# Patient Record
Sex: Male | Born: 1970 | Race: Black or African American | Hispanic: No | Marital: Married | Smoking: Never smoker
Health system: Southern US, Community
[De-identification: ages and names within clinical notes are randomized; demographics above are authoritative.]

## PROBLEM LIST (undated history)

## (undated) DIAGNOSIS — E119 Type 2 diabetes mellitus without complications: Secondary | ICD-10-CM

## (undated) DIAGNOSIS — H35039 Hypertensive retinopathy, unspecified eye: Secondary | ICD-10-CM

## (undated) DIAGNOSIS — E11319 Type 2 diabetes mellitus with unspecified diabetic retinopathy without macular edema: Secondary | ICD-10-CM

## (undated) DIAGNOSIS — I10 Essential (primary) hypertension: Secondary | ICD-10-CM

## (undated) HISTORY — PX: NO PAST SURGERIES: SHX2092

## (undated) HISTORY — DX: Type 2 diabetes mellitus with unspecified diabetic retinopathy without macular edema: E11.319

## (undated) HISTORY — DX: Hypertensive retinopathy, unspecified eye: H35.039

---

## 2016-06-26 ENCOUNTER — Encounter (INDEPENDENT_AMBULATORY_CARE_PROVIDER_SITE_OTHER): Payer: Managed Care, Other (non HMO) | Admitting: Ophthalmology

## 2016-06-26 ENCOUNTER — Encounter (HOSPITAL_COMMUNITY): Payer: Self-pay | Admitting: Emergency Medicine

## 2016-06-26 ENCOUNTER — Observation Stay (HOSPITAL_COMMUNITY)
Admission: EM | Admit: 2016-06-26 | Discharge: 2016-06-28 | Disposition: A | Discharge status: Home / Self Care | Payer: Managed Care, Other (non HMO) | Attending: Internal Medicine | Admitting: Internal Medicine

## 2016-06-26 ENCOUNTER — Observation Stay (HOSPITAL_COMMUNITY): Payer: Managed Care, Other (non HMO)

## 2016-06-26 ENCOUNTER — Emergency Department (HOSPITAL_COMMUNITY): Payer: Managed Care, Other (non HMO)

## 2016-06-26 DIAGNOSIS — Z7982 Long term (current) use of aspirin: Secondary | ICD-10-CM | POA: Diagnosis not present

## 2016-06-26 DIAGNOSIS — I10 Essential (primary) hypertension: Secondary | ICD-10-CM | POA: Diagnosis not present

## 2016-06-26 DIAGNOSIS — E785 Hyperlipidemia, unspecified: Secondary | ICD-10-CM | POA: Insufficient documentation

## 2016-06-26 DIAGNOSIS — H34231 Retinal artery branch occlusion, right eye: Secondary | ICD-10-CM

## 2016-06-26 DIAGNOSIS — E10311 Type 1 diabetes mellitus with unspecified diabetic retinopathy with macular edema: Secondary | ICD-10-CM

## 2016-06-26 DIAGNOSIS — E118 Type 2 diabetes mellitus with unspecified complications: Secondary | ICD-10-CM

## 2016-06-26 DIAGNOSIS — Q211 Atrial septal defect: Secondary | ICD-10-CM

## 2016-06-26 DIAGNOSIS — Z794 Long term (current) use of insulin: Secondary | ICD-10-CM | POA: Diagnosis not present

## 2016-06-26 DIAGNOSIS — E1139 Type 2 diabetes mellitus with other diabetic ophthalmic complication: Secondary | ICD-10-CM | POA: Diagnosis not present

## 2016-06-26 DIAGNOSIS — G453 Amaurosis fugax: Secondary | ICD-10-CM | POA: Diagnosis present

## 2016-06-26 DIAGNOSIS — Z8673 Personal history of transient ischemic attack (TIA), and cerebral infarction without residual deficits: Secondary | ICD-10-CM | POA: Insufficient documentation

## 2016-06-26 DIAGNOSIS — I639 Cerebral infarction, unspecified: Secondary | ICD-10-CM

## 2016-06-26 DIAGNOSIS — H539 Unspecified visual disturbance: Secondary | ICD-10-CM

## 2016-06-26 DIAGNOSIS — H43813 Vitreous degeneration, bilateral: Secondary | ICD-10-CM | POA: Diagnosis not present

## 2016-06-26 DIAGNOSIS — H5461 Unqualified visual loss, right eye, normal vision left eye: Secondary | ICD-10-CM | POA: Insufficient documentation

## 2016-06-26 DIAGNOSIS — H35033 Hypertensive retinopathy, bilateral: Secondary | ICD-10-CM | POA: Diagnosis not present

## 2016-06-26 DIAGNOSIS — R2689 Other abnormalities of gait and mobility: Secondary | ICD-10-CM

## 2016-06-26 DIAGNOSIS — Q2112 Patent foramen ovale: Secondary | ICD-10-CM

## 2016-06-26 DIAGNOSIS — H34211 Partial retinal artery occlusion, right eye: Secondary | ICD-10-CM | POA: Diagnosis not present

## 2016-06-26 DIAGNOSIS — E103513 Type 1 diabetes mellitus with proliferative diabetic retinopathy with macular edema, bilateral: Secondary | ICD-10-CM | POA: Diagnosis not present

## 2016-06-26 DIAGNOSIS — E119 Type 2 diabetes mellitus without complications: Secondary | ICD-10-CM

## 2016-06-26 HISTORY — DX: Essential (primary) hypertension: I10

## 2016-06-26 HISTORY — DX: Type 2 diabetes mellitus without complications: E11.9

## 2016-06-26 LAB — COMPREHENSIVE METABOLIC PANEL
ALT: 16 U/L — AB (ref 17–63)
ANION GAP: 10 (ref 5–15)
AST: 32 U/L (ref 15–41)
Albumin: 4.7 g/dL (ref 3.5–5.0)
Alkaline Phosphatase: 59 U/L (ref 38–126)
BUN: 10 mg/dL (ref 6–20)
CHLORIDE: 101 mmol/L (ref 101–111)
CO2: 27 mmol/L (ref 22–32)
CREATININE: 0.91 mg/dL (ref 0.61–1.24)
Calcium: 10.1 mg/dL (ref 8.9–10.3)
Glucose, Bld: 108 mg/dL — ABNORMAL HIGH (ref 65–99)
Potassium: 4.5 mmol/L (ref 3.5–5.1)
SODIUM: 138 mmol/L (ref 135–145)
Total Bilirubin: 1.2 mg/dL (ref 0.3–1.2)
Total Protein: 7.1 g/dL (ref 6.5–8.1)

## 2016-06-26 LAB — DIFFERENTIAL
BASOS PCT: 1 %
Basophils Absolute: 0 10*3/uL (ref 0.0–0.1)
EOS ABS: 0.4 10*3/uL (ref 0.0–0.7)
EOS PCT: 7 %
Lymphocytes Relative: 45 %
Lymphs Abs: 2.6 10*3/uL (ref 0.7–4.0)
MONO ABS: 0.3 10*3/uL (ref 0.1–1.0)
MONOS PCT: 6 %
NEUTROS ABS: 2.4 10*3/uL (ref 1.7–7.7)
Neutrophils Relative %: 41 %

## 2016-06-26 LAB — CBC
HEMATOCRIT: 46.6 % (ref 39.0–52.0)
Hemoglobin: 15.2 g/dL (ref 13.0–17.0)
MCH: 30.2 pg (ref 26.0–34.0)
MCHC: 32.6 g/dL (ref 30.0–36.0)
MCV: 92.6 fL (ref 78.0–100.0)
PLATELETS: 184 10*3/uL (ref 150–400)
RBC: 5.03 MIL/uL (ref 4.22–5.81)
RDW: 12.3 % (ref 11.5–15.5)
WBC: 5.7 10*3/uL (ref 4.0–10.5)

## 2016-06-26 LAB — I-STAT CHEM 8, ED
BUN: 14 mg/dL (ref 6–20)
CALCIUM ION: 1.15 mmol/L (ref 1.15–1.40)
CHLORIDE: 100 mmol/L — AB (ref 101–111)
Creatinine, Ser: 0.9 mg/dL (ref 0.61–1.24)
GLUCOSE: 105 mg/dL — AB (ref 65–99)
HCT: 48 % (ref 39.0–52.0)
Hemoglobin: 16.3 g/dL (ref 13.0–17.0)
Potassium: 4.4 mmol/L (ref 3.5–5.1)
Sodium: 140 mmol/L (ref 135–145)
TCO2: 28 mmol/L (ref 0–100)

## 2016-06-26 LAB — PROTIME-INR
INR: 1.36
PROTHROMBIN TIME: 16.9 s — AB (ref 11.4–15.2)

## 2016-06-26 LAB — GLUCOSE, CAPILLARY: GLUCOSE-CAPILLARY: 160 mg/dL — AB (ref 65–99)

## 2016-06-26 LAB — I-STAT TROPONIN, ED: Troponin i, poc: 0 ng/mL (ref 0.00–0.08)

## 2016-06-26 LAB — APTT: aPTT: 40 seconds — ABNORMAL HIGH (ref 24–36)

## 2016-06-26 MED ORDER — SODIUM CHLORIDE 0.9 % IV SOLN
INTRAVENOUS | Status: AC
  Administered 2016-06-26: 75 mL/h via INTRAVENOUS

## 2016-06-26 MED ORDER — SODIUM CHLORIDE 0.9 % IV SOLN: INTRAVENOUS | Status: DC

## 2016-06-26 MED ORDER — SENNOSIDES-DOCUSATE SODIUM 8.6-50 MG PO TABS: 1.0000 | ORAL_TABLET | Freq: Every evening | ORAL | Status: DC | PRN

## 2016-06-26 MED ORDER — INSULIN ASPART 100 UNIT/ML ~~LOC~~ SOLN
0.0000 [IU] | Freq: Three times a day (TID) | SUBCUTANEOUS | Status: DC
  Administered 2016-06-27: 2 [IU] via SUBCUTANEOUS

## 2016-06-26 MED ORDER — INSULIN ASPART 100 UNIT/ML ~~LOC~~ SOLN: 0.0000 [IU] | Freq: Every day | SUBCUTANEOUS | Status: DC

## 2016-06-26 MED ORDER — STROKE: EARLY STAGES OF RECOVERY BOOK: Freq: Once | Status: DC

## 2016-06-26 MED ORDER — ENOXAPARIN SODIUM 40 MG/0.4ML ~~LOC~~ SOLN
40.0000 mg | SUBCUTANEOUS | Status: DC
  Administered 2016-06-26 – 2016-06-27 (×2): 40 mg via SUBCUTANEOUS
  Filled 2016-06-26 (×2): qty 0.4

## 2016-06-26 MED ORDER — INSULIN GLARGINE 100 UNIT/ML ~~LOC~~ SOLN
50.0000 [IU] | Freq: Every day | SUBCUTANEOUS | Status: DC
  Administered 2016-06-26 – 2016-06-27 (×2): 50 [IU] via SUBCUTANEOUS
  Filled 2016-06-26 (×4): qty 0.5

## 2016-06-26 MED ORDER — ASPIRIN 81 MG PO CHEW
324.0000 mg | CHEWABLE_TABLET | Freq: Once | ORAL | Status: AC
  Administered 2016-06-26: 324 mg via ORAL
  Filled 2016-06-26: qty 4

## 2016-06-26 NOTE — ED Notes (Signed)
Patient brought back to room with family in tow; patient getting undressed and into a gown at this time; visitor at bedside

## 2016-06-26 NOTE — ED Notes (Signed)
EKG was performed in triage at 1148 by [phone]

## 2016-06-26 NOTE — H&P (Signed)
[facility] History and Physical  Randy Benson WUJ:[id number] DOB: 1970/10/31 DOA: 06/26/2016  PCP: No PCP Per Patient  Patient coming from: Home  Chief Complaint: Vision changes  HPI: Randy Benson is a 45 y.o. male with a medical history of hypertension, diabetes, presented to the emergency department with complaints of visual loss in his right eye. It seems the patient started having blurry and decreased vision in his right eye approximately 2 days ago. He presented to his ophthalmologist, who performed a dilated exam and noted areas of arterial occlusion, and sent him to the emergency department for further workup. Patient states that his blood sugars have been better controlled and were normal this morning. He states his blood pressure has also been controlled with his medications. Patient denies any recent illness, fever, ill contacts, travel, chest pain, shortness of breath, abdominal pain, nausea or vomiting, diarrhea or constipation, dizziness or headache. Patient does endorse having some numbness and tingling in his feet and toes which has been ongoing for quite some time.  ED Course: CT head unremarkable. Neurology consulted. TR[facility] called for admission.   Review of Systems:  All other systems reviewed and are negative.   Past Medical History:  Diagnosis Date  . Diabetes mellitus without complication (HCC)   . Hypertension     History reviewed. No pertinent surgical history.  Social History:  reports that he has never smoked. He has never used smokeless tobacco. He reports that he does not drink alcohol or use drugs.   No Known Allergies  History reviewed.  Denies family history of heart disease.  Positive for diabetes and hypertension.   Prior to Admission medications   Medication Sig Start Date End Date Taking? Authorizing Provider  Amlodipine-Valsartan-HCTZ 10-320-25 MG TABS Take 1 tablet by mouth daily.   Yes Historical Provider, MD  insulin glargine (LANTUS) 100  UNIT/ML injection Inject 50 Units into the skin at bedtime.   Yes Historical Provider, MD  metFORMIN (GLUCOPHAGE) 1000 MG tablet Take 1,000 mg by mouth 2 (two) times daily with a meal.   Yes Historical Provider, MD    Physical Exam: Vitals:   06/26/16 1452 06/26/16 1557  BP: 155/99   Pulse: 86   Resp: 18   Temp:  97.8 F (36.6 C)     General: Well developed, well nourished, NAD, appears stated age  HEENT: NCAT, PERRLA, EOMI, Anicteic Sclera, mucous membranes moist.   Neck: Supple, no JVD, no masses  Cardiovascular: S1 S2 auscultated, no rubs, murmurs or gallops. Regular rate and rhythm.  Respiratory: Clear to auscultation bilaterally with equal chest rise  Abdomen: Soft, nontender, nondistended, + bowel sounds  Extremities: warm dry without cyanosis clubbing or edema  Neuro: AAOx3, decreased/blurry vision in the right eye when left eye is closed. Difficulty with central vision of the right eye.  Otherwise, nonfocal. Strength equal and bilateral in upper/lower ext.  Skin: Without rashes exudates or nodules, several burn marks on left arm  Psych: Normal affect and demeanor with intact judgement and insight  Labs on Admission: I have personally reviewed following labs and imaging studies CBC:  Recent Labs Lab 06/26/16 1150 06/26/16 1201  WBC 5.7  --   NEUTROABS 2.4  --   HGB 15.2 16.3  HCT 46.6 48.0  MCV 92.6  --   PLT 184  --    Basic Metabolic Panel:  Recent Labs Lab 06/26/16 1150 06/26/16 1201  NA 138 140  K 4.5 4.4  CL 101 100*  CO2 27  --  GLUCOSE 108* 105*  BUN 10 14  CREATININE 0.91 0.90  CALCIUM 10.1  --    GFR: Estimated Creatinine Clearance: 119.6 mL/min (by C-G formula based on SCr of 0.9 mg/dL). Liver Function Tests:  Recent Labs Lab 06/26/16 1150  AST 32  ALT 16*  ALKPHOS 59  BILITOT 1.2  PROT 7.1  ALBUMIN 4.7   No results for input(s): LIPASE, AMYLASE in the last 168 hours. No results for input(s): AMMONIA in the last 168  hours. Coagulation Profile:  Recent Labs Lab 06/26/16 1150  INR 1.36   Cardiac Enzymes: No results for input(s): CKTOTAL, CKMB, CKMBINDEX, TROPONINI in the last 168 hours. BNP (last 3 results) No results for input(s): PROBNP in the last 8760 hours. HbA1C: No results for input(s): HGBA1C in the last 72 hours. CBG: No results for input(s): GLUCAP in the last 168 hours. Lipid Profile: No results for input(s): CHOL, HDL, LDLCALC, TRIG, CHOLHDL, LDLDIRECT in the last 72 hours. Thyroid Function Tests: No results for input(s): TSH, T4TOTAL, FREET4, T3FREE, THYROIDAB in the last 72 hours. Anemia Panel: No results for input(s): VITAMINB12, FOLATE, FERRITIN, TIBC, IRON, RETICCTPCT in the last 72 hours. Urine analysis: No results found for: COLORURINE, APPEARANCEUR, LABSPEC, PHURINE, GLUCOSEU, HGBUR, BILIRUBINUR, KETONESUR, PROTEINUR, UROBILINOGEN, NITRITE, LEUKOCYTESUR Sepsis Labs: @LABRCNTIP (procalcitonin:4,lacticidven:4) )No results found for this or any previous visit (from the past 240 hour(s)).   Radiological Exams on Admission: Ct Head Wo Contrast  Result Date: 06/26/2016 CLINICAL DATA:  Right eye vision changes.  No headache. EXAM: CT HEAD WITHOUT CONTRAST TECHNIQUE: Contiguous axial images were obtained from the base of the skull through the vertex without intravenous contrast. COMPARISON:  None. FINDINGS: Brain: No evidence of acute infarction, hemorrhage, hydrocephalus, extra-axial collection or mass lesion/mass effect. Vascular: No hyperdense vessel or unexpected calcification. Skull: No osseous abnormality. Sinuses/Orbits: Visualized paranasal sinuses are clear. Visualized mastoid sinuses are clear. Visualized orbits demonstrate no focal abnormality. Other: None IMPRESSION: No acute intracranial pathology. Electronically Signed   By: Elige KoHetal  Patel   On: 06/26/2016 12:48    EKG: Independently reviewed. 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 Admission, patients condition and plan of care including tests being ordered have been discussed with the patient and wife who indicate understanding and agree with the plan and Code Status.  Disposition Plan: Telemetry for monitoring   Consults called: neurology    Admission status: observation   Time spent: 60 minutes  Randy Benson D.O. [facility] Pager [phone]  If 7PM-7AM, please contact night-coverage [email or web address] Password [email or web address] 06/26/2016, 4:06 PM

## 2016-06-26 NOTE — Consult Note (Signed)
Requesting Physician: ED MD    Chief Complaint: Ocular stroke   HPI:                                                                                                                                         Randy Benson is an 45 y.o. male with hypertension diabetes. Patient had noticed blurred vision in his right eye and right visual field for the last 2 days. Patient admitted appointment to see his ophthalmologist to did a dilated pupil exam and noted that he had to areas of embolic strokes in the retina. Patient was sent to the ED for further stroke evaluation. Currently patient has no other symptoms other than mild blurred vision in the central aspect of of his vision of only the right eye. She states that he does take aspirin on a daily basis.  Date last known well: Date: 06/25/2016 Time last known well: Unable to determine tPA Given: No: Patient is outside the window.   Past Medical History:  Diagnosis Date  . Diabetes mellitus without complication (HCC)   . Hypertension     History reviewed. No pertinent surgical history.  History reviewed. No pertinent family history. Social History:  reports that he has never smoked. He has never used smokeless tobacco. He reports that he does not drink alcohol or use drugs.  Allergies: No Known Allergies  Medications:                                                                                                                           Current Facility-Administered Medications  Medication Dose Route Frequency Provider Last Rate Last Dose  . 0.9 %  sodium chloride infusion   Intravenous Continuous Gwyneth Sprout, MD      . aspirin chewable tablet 324 mg  324 mg Oral Once Gwyneth Sprout, MD       Current Outpatient Prescriptions  Medication Sig Dispense Refill  . Amlodipine-Valsartan-HCTZ 10-320-25 MG TABS Take 1 tablet by mouth daily.    . insulin glargine (LANTUS) 100 UNIT/ML injection Inject 50 Units into the skin at bedtime.    .  metFORMIN (GLUCOPHAGE) 1000 MG tablet Take 1,000 mg by mouth 2 (two) times daily with a meal.       ROS:  History obtained from the patient  General ROS: negative for - chills, fatigue, fever, night sweats, weight gain or weight loss Psychological ROS: negative for - behavioral disorder, hallucinations, memory difficulties, mood swings or suicidal ideation Ophthalmic ROS: negative for - blurry vision, double vision, eye pain or loss of vision ENT ROS: negative for - epistaxis, nasal discharge, oral lesions, sore throat, tinnitus or vertigo Allergy and Immunology ROS: negative for - hives or itchy/watery eyes Hematological and Lymphatic ROS: negative for - bleeding problems, bruising or swollen lymph nodes Endocrine ROS: negative for - galactorrhea, hair pattern changes, polydipsia/polyuria or temperature intolerance Respiratory ROS: negative for - cough, hemoptysis, shortness of breath or wheezing Cardiovascular ROS: negative for - chest pain, dyspnea on exertion, edema or irregular heartbeat Gastrointestinal ROS: negative for - abdominal pain, diarrhea, hematemesis, nausea/vomiting or stool incontinence Genito-Urinary ROS: negative for - dysuria, hematuria, incontinence or urinary frequency/urgency Musculoskeletal ROS: negative for - joint swelling or muscular weakness Neurological ROS: as noted in HPI Dermatological ROS: negative for rash and skin lesion changes  Neurologic Examination:                                                                                                      Blood pressure 155/99, pulse 86, temperature 98.1 F (36.7 C), temperature source Oral, resp. rate 18, height 5\' 11"  (1.803 m), weight 95.3 kg (210 lb), SpO2 100 %.  HEENT-  Normocephalic, no lesions, without obvious abnormality.  Normal external eye and conjunctiva.  Normal  TM's bilaterally.  Normal auditory canals and external ears. Normal external nose, mucus membranes and septum.  Normal pharynx. Cardiovascular- S1, S2 normal, pulses palpable throughout   Lungs- chest clear, no wheezing, rales, normal symmetric air entry Abdomen- normal findings: bowel sounds normal Extremities- no edema Lymph-no adenopathy palpable Musculoskeletal-no joint tenderness, deformity or swelling Skin-warm and dry, no hyperpigmentation, vitiligo, or suspicious lesions  Neurological Examination Mental Status: Alert, oriented, thought content appropriate.  Speech fluent without evidence of aphasia.  Able to follow 3 step commands without difficulty. Cranial Nerves: II:  Visual fields grossly normal however one the left eye is covered patient states that he has decreased vision in the central and superior quadrant of his vision. pupils equal, round, reactive to light and accommodation III,IV, VI: ptosis not present, extra-ocular motions intact bilaterally V,VII: smile symmetric, facial light touch sensation normal bilaterally VIII: hearing normal bilaterally IX,X: uvula rises symmetrically XI: bilateral shoulder shrug XII: midline tongue extension Motor: Right : Upper extremity   5/5    Left:     Upper extremity   5/5  Lower extremity   5/5     Lower extremity   5/5 Tone and bulk:normal tone throughout; no atrophy noted Sensory: Pinprick and light touch intact throughout, bilaterally Deep Tendon Reflexes: 2+ and symmetric throughout Plantars: Right: downgoing   Left: downgoing Cerebellar: normal finger-to-nose, and normal heel-to-shin test Gait: Not Tested       Lab Results: Basic Metabolic Panel:  Recent Labs Lab 06/26/16 1150 06/26/16 1201  NA 138 140  K 4.5 4.4  CL 101  100*  CO2 27  --   GLUCOSE 108* 105*  BUN 10 14  CREATININE 0.91 0.90  CALCIUM 10.1  --     Liver Function Tests:  Recent Labs Lab 06/26/16 1150  AST 32  ALT 16*  ALKPHOS 59   BILITOT 1.2  PROT 7.1  ALBUMIN 4.7   No results for input(s): LIPASE, AMYLASE in the last 168 hours. No results for input(s): AMMONIA in the last 168 hours.  CBC:  Recent Labs Lab 06/26/16 1150 06/26/16 1201  WBC 5.7  --   NEUTROABS 2.4  --   HGB 15.2 16.3  HCT 46.6 48.0  MCV 92.6  --   PLT 184  --     Cardiac Enzymes: No results for input(s): CKTOTAL, CKMB, CKMBINDEX, TROPONINI in the last 168 hours.  Lipid Panel: No results for input(s): CHOL, TRIG, HDL, CHOLHDL, VLDL, LDLCALC in the last 168 hours.  CBG: No results for input(s): GLUCAP in the last 168 hours.  Microbiology: No results found for this or any previous visit.  Coagulation Studies:  Recent Labs  06/26/16 1150  LABPROT 16.9*  INR 1.36    Imaging: Ct Head Wo Contrast  Result Date: 06/26/2016 CLINICAL DATA:  Right eye vision changes.  No headache. EXAM: CT HEAD WITHOUT CONTRAST TECHNIQUE: Contiguous axial images were obtained from the base of the skull through the vertex without intravenous contrast. COMPARISON:  None. FINDINGS: Brain: No evidence of acute infarction, hemorrhage, hydrocephalus, extra-axial collection or mass lesion/mass effect. Vascular: No hyperdense vessel or unexpected calcification. Skull: No osseous abnormality. Sinuses/Orbits: Visualized paranasal sinuses are clear. Visualized mastoid sinuses are clear. Visualized orbits demonstrate no focal abnormality. Other: None IMPRESSION: No acute intracranial pathology. Electronically Signed   By: Elige KoHetal  Patel   On: 06/26/2016 12:48       Assessment and plan discussed with with attending physician and they are in agreement.    Felicie MornDavid Smith PA-C [facility] [phone]  06/26/2016, 3:34 PM   Assessment: 45 y.o. 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 Patient is here for stroke workup.  Stroke Risk Factors  - diabetes mellitus and hypertension   1. HgbA1c, fasting lipid panel 2. MRI, MRA  of the brain without contrast, MRA neck with contast 3. PT consult, OT consult, Speech consult 4. Echocardiogram 6. Prophylactic therapy-Antiplatelet med: Aspirin - dose 325 mg daily 7. Risk factor modification 8. Telemetry monitoring 9. Frequent neuro checks 10 NPO until passes stroke swallow screen 11 please page stroke NP  Or  PA  Or MD from 8am -4 pm  as this patient from this time will be  followed by the stroke.   You can look them up on [email or web address]  Password [email or web address]   Addendum Patient was seen and examined. Agree with the physician assistant documentation assessment and plan

## 2016-06-26 NOTE — ED Notes (Signed)
CBG results per lab work

## 2016-06-26 NOTE — ED Provider Notes (Signed)
[facility]-EMERGENCY DEPT Provider Note   CSN: [id number] Arrival date & time: 06/26/16  1133     History   Chief Complaint Chief Complaint  Patient presents with  . vision changes    HPI Randy Benson is a 45 y.o. male.  Pt is 45 y/o male with hx of HTN and DM being sent in today by Dr. Ashley Benson ophthalmologist for loss of vision in his right eye.  Pt notes on Tuesday he had painless loss of central vision in his right eye.  Patient states he's never had anything like this before. Denies headache, numbness, weakness, speech or swallowing difficulties. No gait disturbance. Patient states recently his blood sugar has been much more controlled between 85 and 100 and blood pressure is typically controlled with medications. When he saw the ophthalmologist today they told him he had an arterial occlusion and sent him here for further workup.  He denies any complaints of generalized illness, fever, shortness of breath, chest pain.   The history is provided by the patient (MD).    Past Medical History:  Diagnosis Date  . Diabetes mellitus without complication (HCC)   . Hypertension     There are no active problems to display for this patient.   History reviewed. No pertinent surgical history.     Home Medications    Prior to Admission medications   Medication Sig Start Date End Date Taking? Authorizing Provider  Amlodipine-Valsartan-HCTZ 10-320-25 MG TABS Take 1 tablet by mouth daily.   Yes Historical Provider, MD  insulin glargine (LANTUS) 100 UNIT/ML injection Inject 50 Units into the skin at bedtime.   Yes Historical Provider, MD  metFORMIN (GLUCOPHAGE) 1000 MG tablet Take 1,000 mg by mouth 2 (two) times daily with a meal.   Yes Historical Provider, MD    Family History History reviewed. No pertinent family history.  Social History Social History  Substance Use Topics  . Smoking status: Never Smoker  . Smokeless tobacco: Never Used  . Alcohol use No     Allergies     Review of patient's allergies indicates not on file.   Review of Systems Review of Systems  All other systems reviewed and are negative.    Physical Exam Updated Vital Signs BP 155/99 (BP Location: Right Arm)   Pulse 86   Temp 98.1 F (36.7 C) (Oral)   Resp 18   Ht 5\' 11"  (1.803 m)   Wt 210 lb (95.3 kg)   SpO2 100%   BMI 29.29 kg/m   Physical Exam  Constitutional: He is oriented to person, place, and time. He appears well-developed and well-nourished. No distress.  HENT:  Head: Normocephalic and atraumatic.  Mouth/Throat: Oropharynx is clear and moist.  Eyes: Conjunctivae and EOM are normal. Pupils are equal, round, and reactive to light.  Loss of central vision in the right eye. Peripheral vision is intact  Neck: Normal range of motion. Neck supple.  Cardiovascular: Normal rate, regular rhythm and intact distal pulses.   No murmur heard. Pulmonary/Chest: Effort normal and breath sounds normal. No respiratory distress. He has no wheezes. He has no rales.  Abdominal: Soft. He exhibits no distension. There is no tenderness. There is no rebound and no guarding.  Musculoskeletal: Normal range of motion. He exhibits no edema or tenderness.  Neurological: He is alert and oriented to person, place, and time. He has normal strength. No cranial nerve deficit or sensory deficit. Coordination and gait normal.  Normal finger to nose  Skin: Skin is warm  and dry. No rash noted. No erythema.  Psychiatric: He has a normal mood and affect. His behavior is normal.  Nursing note and vitals reviewed.    ED Treatments / Results  Labs (all labs ordered are listed, but only abnormal results are displayed) Labs Reviewed  PROTIME-INR - Abnormal; Notable for the following:       Result Value   Prothrombin Time 16.9 (*)    All other components within normal limits  APTT - Abnormal; Notable for the following:    aPTT 40 (*)    All other components within normal limits  COMPREHENSIVE  METABOLIC PANEL - Abnormal; Notable for the following:    Glucose, Bld 108 (*)    ALT 16 (*)    All other components within normal limits  I-STAT CHEM 8, ED - Abnormal; Notable for the following:    Chloride 100 (*)    Glucose, Bld 105 (*)    All other components within normal limits  CBC  DIFFERENTIAL  I-STAT TROPOININ, ED  CBG MONITORING, ED    EKG  EKG Interpretation  Date/Time:  Friday June 26 2016 11:48:09 EDT Ventricular Rate:  86 PR Interval:  148 QRS Duration: 92 QT Interval:  346 QTC Calculation: 414 R Axis:   77 Text Interpretation:  Normal sinus rhythm Moderate voltage criteria for LVH, may be normal variant No previous tracing Confirmed by Anitra Lauth  MD, Kimberly Coye ([id number]) on 06/26/2016 3:03:01 PM       Radiology Ct Head Wo Contrast  Result Date: 06/26/2016 CLINICAL DATA:  Right eye vision changes.  No headache. EXAM: CT HEAD WITHOUT CONTRAST TECHNIQUE: Contiguous axial images were obtained from the base of the skull through the vertex without intravenous contrast. COMPARISON:  None. FINDINGS: Brain: No evidence of acute infarction, hemorrhage, hydrocephalus, extra-axial collection or mass lesion/mass effect. Vascular: No hyperdense vessel or unexpected calcification. Skull: No osseous abnormality. Sinuses/Orbits: Visualized paranasal sinuses are clear. Visualized mastoid sinuses are clear. Visualized orbits demonstrate no focal abnormality. Other: None IMPRESSION: No acute intracranial pathology. Electronically Signed   By: Elige Ko   On: 06/26/2016 12:48    Procedures Procedures (including critical care time)  Medications Ordered in ED Medications - No data to display   Initial Impression / Assessment and Plan / ED Course  I have reviewed the triage vital signs and the nursing notes.  Pertinent labs & imaging results that were available during my care of the patient were reviewed by me and considered in my medical decision making (see chart for  details).  Clinical Course   Patient is a 45 year old male presenting today with 2 emboli in the artery of the right eye. Spoke with Dr. Ashley Benson who states he sent the patient to the emergency room for stroke workup as to the source of the emboli. It could be from patient's diabetes and hypertension as in the past his diabetes has been very out of control. However also concern it could be something from a heart valve versus carotid occlusion. A shunt denies any other strokelike symptoms such as unilateral weakness or numbness. He does have symptoms of mild neuropathy in his toes but it is bilateral.  He denies any symptoms concerning for endocarditis. Patient's blood work, EKG and CT of his head is unremarkable. Blood sugar today is 105 but he states his most recent A1c was 8. We'll discuss with neurology most likely admit patient for stroke workup.  3:28 PM Neuro saw pt and he will be admitted for stroke  work up and aspirin/ ivf given  Final Clinical Impressions(s) / ED Diagnoses   Final diagnoses:  Amaurosis fugax of right eye    New Prescriptions New Prescriptions   No medications on file     Gwyneth SproutWhitney Hiilani Jetter, MD 06/26/16 [phone]

## 2016-06-26 NOTE — ED Notes (Signed)
Patient developed blurry vision right eye 4 days ago and continued today. Seen eye doctor today for left eye surgery. Sent to ED for evaluation. Patient continues to have blurry vision. Denies pain. Alert answering and following commands appropriate NIHSS and swallow screen completed passed. Ate dinner tray in ED.

## 2016-06-26 NOTE — ED Notes (Signed)
Admit Doctor at bedside.  

## 2016-06-26 NOTE — ED Notes (Signed)
IV team at bedside 

## 2016-06-26 NOTE — ED Notes (Signed)
Neurology at bedside.

## 2016-06-26 NOTE — ED Triage Notes (Signed)
Pt presents to ED for assessment of right visual change.  Pt was seen by Dr. Alan MulderJohn Benson [phone]).  Pt sts he began to have blurred vision in the right eye Monday.  Pt sts his left eye seemed to blur some as well.  Eye Dr. Danie Bindersts patient appears to "have had a stroke" and "there is a blockage" in the right eye.

## 2016-06-26 NOTE — ED Notes (Signed)
Patient undressed, in gown, on monitor, continuous pulse oximetry and blood pressure cuff 

## 2016-06-26 NOTE — ED Notes (Signed)
Attempted IV unable to obtain IV x 2.

## 2016-06-27 ENCOUNTER — Observation Stay (HOSPITAL_BASED_OUTPATIENT_CLINIC_OR_DEPARTMENT_OTHER): Payer: Managed Care, Other (non HMO)

## 2016-06-27 DIAGNOSIS — H539 Unspecified visual disturbance: Secondary | ICD-10-CM

## 2016-06-27 DIAGNOSIS — I6789 Other cerebrovascular disease: Secondary | ICD-10-CM | POA: Diagnosis not present

## 2016-06-27 DIAGNOSIS — Q211 Atrial septal defect: Secondary | ICD-10-CM

## 2016-06-27 DIAGNOSIS — I10 Essential (primary) hypertension: Secondary | ICD-10-CM | POA: Diagnosis not present

## 2016-06-27 DIAGNOSIS — E118 Type 2 diabetes mellitus with unspecified complications: Secondary | ICD-10-CM | POA: Diagnosis not present

## 2016-06-27 LAB — ECHOCARDIOGRAM COMPLETE
HEIGHTINCHES: 71 in
WEIGHTICAEL: 3360 [oz_av]

## 2016-06-27 LAB — GLUCOSE, CAPILLARY
GLUCOSE-CAPILLARY: 96 mg/dL (ref 65–99)
GLUCOSE-CAPILLARY: 199 mg/dL — AB (ref 65–99)
GLUCOSE-CAPILLARY: 98 mg/dL (ref 65–99)
GLUCOSE-CAPILLARY: 139 mg/dL — AB (ref 65–99)

## 2016-06-27 LAB — LIPID PANEL
Cholesterol: 168 mg/dL (ref 0–200)
HDL: 49 mg/dL (ref >40)
LDL CALC: 101 mg/dL — AB (ref 0–99)
Total CHOL/HDL Ratio: 3.4 RATIO
Triglycerides: 92 mg/dL (ref <150)
VLDL: 18 mg/dL (ref 0–40)

## 2016-06-27 MED ORDER — ATORVASTATIN CALCIUM 10 MG PO TABS
10.0000 mg | ORAL_TABLET | Freq: Every day | ORAL | Status: DC
  Administered 2016-06-27: 10 mg via ORAL
  Filled 2016-06-27: qty 1

## 2016-06-27 MED ORDER — ASPIRIN EC 325 MG PO TBEC
325.0000 mg | DELAYED_RELEASE_TABLET | Freq: Every day | ORAL | Status: DC
  Administered 2016-06-27 – 2016-06-28 (×2): 325 mg via ORAL
  Filled 2016-06-27 (×3): qty 1

## 2016-06-27 NOTE — Progress Notes (Signed)
STROKE TEAM PROGRESS NOTE   HISTORY OF PRESENT ILLNESS (per record) Randy Benson is an 45 y.o. male with hypertension and diabetes. Patient had noticed blurred vision in his right eye and right visual field for the last 2 days. Patient admitted appointment to see his ophthalmologist to did a dilated pupil exam and noted that he had to areas of embolic strokes in the retina. Patient was sent to the ED for further stroke evaluation. Currently patient has no other symptoms other than mild blurred vision in the central aspect of of his vision of only the right eye. He states that he does take aspirin on a daily basis.  Date last known well: Date: 06/25/2016 Time last known well: Unable to determine tPA Given: No: Patient is outside the window.   SUBJECTIVE (INTERVAL HISTORY) The patient's wife is at the bedside. The patient reports she is feeling better. Plan to rule out PFO.    OBJECTIVE Temp:  [98.1 F (36.7 C)-99.1 F (37.3 C)] 99.1 F (37.3 C) (10/07 1449) Pulse Rate:  [73-87] 82 (10/07 1449) Cardiac Rhythm: Normal sinus rhythm (10/07 1300) Resp:  [16-18] 18 (10/07 1449) BP: (118-164)/(76-98) 141/80 (10/07 1449) SpO2:  [96 %-100 %] 100 % (10/07 1449)  CBC:   Recent Labs Lab 06/26/16 1150 06/26/16 1201  WBC 5.7  --   NEUTROABS 2.4  --   HGB 15.2 16.3  HCT 46.6 48.0  MCV 92.6  --   PLT 184  --     Basic Metabolic Panel:   Recent Labs Lab 06/26/16 1150 06/26/16 1201  NA 138 140  K 4.5 4.4  CL 101 100*  CO2 27  --   GLUCOSE 108* 105*  BUN 10 14  CREATININE 0.91 0.90  CALCIUM 10.1  --     Lipid Panel:     Component Value Date/Time   CHOL 168 06/27/2016 0146   TRIG 92 06/27/2016 0146   HDL 49 06/27/2016 0146   CHOLHDL 3.4 06/27/2016 0146   VLDL 18 06/27/2016 0146   LDLCALC 101 (H) 06/27/2016 0146   HgbA1c: No results found for: HGBA1C Urine Drug Screen: No results found for: LABOPIA, COCAINSCRNUR, LABBENZ, AMPHETMU, THCU, LABBARB    IMAGING  Dg Chest  2 View 06/26/2016 No active cardiopulmonary disease.   Ct Head Wo Contrast 06/26/2016 No acute intracranial pathology.   Mr Maxine GlennMra Head/brain Wo Cm 06/26/2016 Normal MRI/MRA of the brain and intracranial arteries.     PHYSICAL EXAM HEENT-  Normocephalic, no lesions, without obvious abnormality.  Normal external eye and conjunctiva.  Normal TM's bilaterally.  Normal auditory canals and external ears. Normal external nose, mucus membranes and septum.  Normal pharynx. Cardiovascular- S1, S2 normal, pulses palpable throughout   Lungs- chest clear, no wheezing, rales, normal symmetric air entry Abdomen- normal findings: bowel sounds normal Extremities- no edema Lymph-no adenopathy palpable Musculoskeletal-no joint tenderness, deformity or swelling Skin-warm and dry, no hyperpigmentation, vitiligo, or suspicious lesions  Neurological Examination Mental Status: Alert, oriented, thought content appropriate.  Speech fluent without evidence of aphasia.  Able to follow 3 step commands without difficulty. Cranial Nerves: II:  Visual fields grossly normal however once the left eye is covered patient states that he has decreased vision in the central and superior quadrant of his vision. pupils equal, round, reactive to light and accommodation III,IV, VI: ptosis not present, extra-ocular motions intact bilaterally V,VII: smile symmetric, facial light touch sensation normal bilaterally VIII: hearing normal bilaterally IX,X: uvula rises symmetrically XI: bilateral shoulder shrug XII:  midline tongue extension Motor: Right :  Upper extremity   5/5                                      Left:     Upper extremity   5/5             Lower extremity   5/5                                                  Lower extremity   5/5 Tone and bulk:normal tone throughout; no atrophy noted Sensory: Pinprick and light touch intact throughout, bilaterally Deep Tendon Reflexes: 2+ and symmetric  throughout Plantars: Right: downgoing                                Left: downgoing Cerebellar: normal finger-to-nose, and normal heel-to-shin test Gait: Not Tested    ASSESSMENT/PLAN Mr. Randy Benson is a 45 y.o. male with history of diabetes mellitus and hypertension presenting with visual changes. Marland KitchenHe did not receive IV t-PA due to the presentation.  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  Resultant  Right eye diminished vision acuity  MRI  normal  MRA  normal  Carotid Doppler - pending  Lower extremity duplex - pending  2D Echo  EF 55-60%. No cardiac source of emboli identified.  LDL - 101  HgbA1c pending  VTE prophylaxis -  Lovenox Diet heart healthy/carb modified Room service appropriate? Yes; Fluid consistency: Thin  No antithrombotic prior to admission, now on No antithrombotic - will order aspirin 325 mg daily  Patient counseled to be compliant with his antithrombotic medications  Ongoing aggressive stroke risk factor management  Therapy recommendations:  No follow-up therapies recommended  Disposition:  Pending  Hypertension  Stable  Permissive hypertension (OK if < 220/120) but gradually normalize in 5-7 days  Long-term BP goal normotensive  Hyperlipidemia  Home meds: No lipid lowering medications prior to admission  LDL 101, goal < 70  Now on Lipitor 10 mg daily. Suspect he will need a higher dose.  Continue statin at discharge  Diabetes  HgbA1c pending, goal < 7.0  Controlled  Other Stroke Risk Factors  Obesity, Body mass index is 29.29 kg/m., recommend weight loss, diet and exercise as appropriate    Other Active Problems  Check lower extremity duplex and carotid dopplers  Rule out PFO  Hospital day # 0  Delton See PA-C [facility] Pager [phone] 06/27/2016, 4:41 PM I have personally examined this patient, reviewed notes, independently viewed  imaging studies, participated in medical decision making and plan of care.ROS completed by me personally and pertinent positives fully documented  I have made any additions or clarifications directly to the above note. Agree with note above. Check carotid dopplers and LE venous dopplers for DVT given echo suggests PFO.Long d/w patient, wife and Dr Catha Gosselin.Greater than 0% time during this 35 minute visit was spent on counselling and coordination of care about vision loss and stroke Edmonia Lynch, MD Medical Director [facility] Pager: [phone] 06/27/2016 5:38 PM   To contact Stroke Continuity provider, please refer to [email or web address]. After hours,  contact General Neurology

## 2016-06-27 NOTE — Evaluation (Addendum)
Physical Therapy Evaluation Patient Details Name: Randy Benson MRN: [id number] DOB: 08/20/1971 Today's Date: 06/27/2016   History of Present Illness  is a 45 y.o. male with a medical history of hypertension, diabetes, presented to the emergency department with complaints of visual loss in his right eye. It seems the patient started having blurry and decreased vision in his right eye approximately 2 days ago. He presented to his ophthalmologist, who performed a dilated exam and noted areas of arterial occlusion.  Other medical workup negative for CVA  Clinical Impression  Pt modified independent with mobility, safe to ambulate on unit without assist.  Advised pt to occlude Lt eye 10-15 mins at a time to strengthen Rt eye.  Discussed fall prevention strategies including use of contrasting tape on stairs as his depth perception may not be reliable at this time.  Pt to perform tandem and single limb stance at counter at home to further improve his balance.  No need for Occupational therapy as all education completed.  No further therapy warranted at this time.    Follow Up Recommendations No PT follow up    Equipment Recommendations  None recommended by PT    Recommendations for Other Services       Precautions / Restrictions Precautions Precautions: None Precaution Comments: blurry vision Rt eye Restrictions Weight Bearing Restrictions: No      Mobility  Bed Mobility Overal bed mobility: Independent             General bed mobility comments: supine to/from sit  Transfers Overall transfer level: Independent Equipment used: None             General transfer comment: sit to stand and stand pivot  Ambulation/Gait Ambulation/Gait assistance: Independent Ambulation Distance (Feet): 500 Feet Assistive device: None Gait Pattern/deviations: WFL(Within Functional Limits) Gait velocity: WNL   General Gait Details: candence WNL, no LOB with environmental scanning  Stairs             Wheelchair Mobility    Modified Rankin (Stroke Patients Only) Modified Rankin (Stroke Patients Only) Pre-Morbid Rankin Score: No symptoms Modified Rankin: Slight disability     Balance Overall balance assessment: Independent                                           Pertinent Vitals/Pain Pain Assessment: No/denies pain    Home Living Family/patient expects to be discharged to:: Private residence Living Arrangements: Spouse/significant other Available Help at Discharge: Family;Available PRN/intermittently Type of Home: House Home Access: Stairs to enter Entrance Stairs-Rails: Left Entrance Stairs-Number of Steps: 3 Home Layout: Two level;Bed/bath upstairs (small dog) Home Equipment: None      Prior Function Level of Independence: Independent               Hand Dominance   Dominant Hand: Right    Extremity/Trunk Assessment   Upper Extremity Assessment: Overall WFL for tasks assessed           Lower Extremity Assessment: Overall WFL for tasks assessed      Cervical / Trunk Assessment: Normal  Communication   Communication: No difficulties  Cognition Arousal/Alertness: Awake/alert Behavior During Therapy: WFL for tasks assessed/performed Overall Cognitive Status: Within Functional Limits for tasks assessed                      General Comments General comments (skin integrity, edema,  etc.): tandem 30 sec, Single limb stance Rt 10.95, Lt 6.99    Exercises     Assessment/Plan    PT Assessment Patent does not need any further PT services  PT Problem List            PT Treatment Interventions      PT Goals (Current goals can be found in the Care Plan section)  Acute Rehab PT Goals Patient Stated Goal: regain vision PT Goal Formulation: With patient/family    Frequency     Barriers to discharge        Co-evaluation               End of Session   Activity Tolerance: Patient tolerated  treatment well Patient left: in chair;with call bell/phone within reach;with family/visitor present Nurse Communication: Mobility status    Functional Assessment Tool Used: gait, balance Functional Limitation: Mobility: Walking and moving around Mobility: Walking and Moving Around Current Status ([id number]): At least 1 percent but less than 20 percent impaired, limited or restricted Mobility: Walking and Moving Around Goal Status [phone]): At least 1 percent but less than 20 percent impaired, limited or restricted Mobility: Walking and Moving Around Discharge Status [phone]): At least 1 percent but less than 20 percent impaired, limited or restricted    Time: 1215-1240 PT Time Calculation (min) (ACUTE ONLY): 25 min   Charges:   PT Evaluation $PT Eval Low Complexity: 1 Procedure PT Treatments $Neuromuscular Re-education: 8-22 mins   PT G Codes:   PT G-Codes **NOT FOR INPATIENT CLASS** Functional Assessment Tool Used: gait, balance Functional Limitation: Mobility: Walking and moving around Mobility: Walking and Moving Around Current Status (B1478): At least 1 percent but less than 20 percent impaired, limited or restricted Mobility: Walking and Moving Around Goal Status [phone]): At least 1 percent but less than 20 percent impaired, limited or restricted Mobility: Walking and Moving Around Discharge Status [phone]): At least 1 percent but less than 20 percent impaired, limited or restricted   Nestor Lewandowsky, [place] [phone]  Randy Benson 06/27/2016, 1:16 PM

## 2016-06-27 NOTE — Progress Notes (Signed)
VASCULAR LAB PRELIMINARY  PRELIMINARY  PRELIMINARY  PRELIMINARY  Carotid duplex completed.    Preliminary report:  1-39% ICA plaquing.  Vertebral artery flow is antegrade.  Damon Hargrove, RVT 06/27/2016, 7:15 PM

## 2016-06-27 NOTE — Progress Notes (Signed)
Echocardiogram 2D Echocardiogram has been performed. Attempted bubble study with out success due to staff availability.  Randy Benson, Randy Benson 06/27/2016, 12:13 PM

## 2016-06-27 NOTE — Progress Notes (Signed)
OT Cancellation Note  Patient Details Name: Ronelle Nighony Betty MRN: [id number] DOB: 02/22/1971   Cancelled Treatment:    Reason Eval/Treat Not Completed: OT screened, no needs identified, will sign off. Per discussion with PT and pt, pt educated on compensatory strategies for vision deficits. No acute OT needs identified at this time. Discussed following up with MDs as will be indicated in d/c paperwork and potential options for therapy at a later date depending on progression of recovery (OPOT, vision therapy). Will sign off at this time.   Pilar GrammesMathews, Rollan Roger H 06/27/2016, 1:43 PM

## 2016-06-27 NOTE — Progress Notes (Signed)
VASCULAR LAB PRELIMINARY  PRELIMINARY  PRELIMINARY  PRELIMINARY  Bilateral lower extremity venous duplex completed.    Preliminary report:  There is no DVT or SVT noted in the bilateral lower extremities.  Dellas Guard, RVT 06/27/2016, 7:17 PM

## 2016-06-27 NOTE — Plan of Care (Signed)
Problem: Education: Goal: Knowledge of disease or condition will improve Outcome: Progressing Pt told that he can't eat until cleared by MD or speech for swallowing. Her verbalized understanding.

## 2016-06-27 NOTE — Progress Notes (Signed)
PROGRESS NOTE    Randy Benson  [id number] DOB: 03/20/1971 DOA: 06/26/2016 PCP: No PCP Per Patient   Chief Complaint  Patient presents with  . vision changes     Brief Narrative:  HPI On 06/26/2016 Randy Benson is a 45 y.o. male with a medical history of hypertension, diabetes, presented to the emergency department with complaints of visual loss in his right eye. It seems the patient started having blurry and decreased vision in his right eye approximately 2 days ago. He presented to his ophthalmologist, who performed a dilated exam and noted areas of arterial occlusion, and sent him to the emergency department for further workup. Patient states that his blood sugars have been better controlled and were normal this morning. He states his blood pressure has also been controlled with his medications. Patient denies any recent illness, fever, ill contacts, travel, chest pain, shortness of breath, abdominal pain, nausea or vomiting, diarrhea or constipation, dizziness or headache. Patient does endorse having some numbness and tingling in his feet and toes which has been ongoing for quite some time.  Assessment & Plan   Change in vision/Vision Loss, rule out CVA -Patient does have risk factors for CVA (HTN, DM) -Presented to his ophthalmologist who didn't exam showing patient had multiple areas of embolic strokes in the retina -Neurology consulted and appreciated -CT head unremarkable -MRI/MRA: Normal -Pending echocardiogram, carotid Doppler, hemoglobin A1c  -Lipid panel: Total cholesterol 168, triglycerides 92, HDL 49, LDL 101 -Conduction or checks -PT, OT consulted -Continue aspirin -Started patient on low dose atorvastatin  -pending further recommendations from neurology   Essential hypertension -Held amlodipine/valsartan/HCTZ -Allow for permissive hypertension -BP has been normotensive   Diabetes mellitus, type II -Hemoglobin A1c ordered -Continue Lantus, ISS and CBG  monitoring  -Held metformin  DVT Prophylaxis  Lovenox  Code Status: Full  Family Communication: Wife at bedside  Disposition Plan: In observation  Consultants Neurology  Procedures  None  Antibiotics   Anti-infectives    None      Subjective:   Randy Benson seen and examined today.  Continues to have "blurry vision" and central vision loss in the right eye.  Denies chest pain, shortness of breath, abdominal pain, N/V/D/C, headache, dizziness.   Objective:   Vitals:   06/27/16 0240 06/27/16 0451 06/27/16 0629 06/27/16 1020  BP: 132/83 118/76 125/83 140/77  Pulse: 85 78 73 85  Resp: 16 16 16 18   Temp:   98.4 F (36.9 C) 98.5 F (36.9 C)  TempSrc:   Oral Oral  SpO2: 96% 99% 97% 100%  Weight:      Height:        Intake/Output Summary (Last 24 hours) at 06/27/16 1214 Last data filed at 06/27/16 0700  Gross per 24 hour  Intake                0 ml  Output                1 ml  Net               -1 ml   Filed Weights   06/26/16 1144  Weight: 95.3 kg (210 lb)    Exam  General: Well developed, well nourished, NAD, appears stated age  HEENT: NCAT,  mucous membranes moist.   Cardiovascular: S1 S2 auscultated, no rubs, murmurs or gallops. Regular rate and rhythm.  Respiratory: Clear to auscultation bilaterally with equal chest rise  Abdomen: Soft, nontender, nondistended, + bowel sounds  Extremities: warm  dry without cyanosis clubbing or edema  Neuro: AAOx3, Right central vision loss, otherwise nonfocal  Psych: Normal affect and demeanor with intact judgement and insight  Data Reviewed: I have personally reviewed following labs and imaging studies  CBC:  Recent Labs Lab 06/26/16 1150 06/26/16 1201  WBC 5.7  --   NEUTROABS 2.4  --   HGB 15.2 16.3  HCT 46.6 48.0  MCV 92.6  --   PLT 184  --    Basic Metabolic Panel:  Recent Labs Lab 06/26/16 1150 06/26/16 1201  NA 138 140  K 4.5 4.4  CL 101 100*  CO2 27  --   GLUCOSE 108* 105*  BUN 10  14  CREATININE 0.91 0.90  CALCIUM 10.1  --    GFR: Estimated Creatinine Clearance: 119.6 mL/min (by C-G formula based on SCr of 0.9 mg/dL). Liver Function Tests:  Recent Labs Lab 06/26/16 1150  AST 32  ALT 16*  ALKPHOS 59  BILITOT 1.2  PROT 7.1  ALBUMIN 4.7   No results for input(s): LIPASE, AMYLASE in the last 168 hours. No results for input(s): AMMONIA in the last 168 hours. Coagulation Profile:  Recent Labs Lab 06/26/16 1150  INR 1.36   Cardiac Enzymes: No results for input(s): CKTOTAL, CKMB, CKMBINDEX, TROPONINI in the last 168 hours. BNP (last 3 results) No results for input(s): PROBNP in the last 8760 hours. HbA1C: No results for input(s): HGBA1C in the last 72 hours. CBG:  Recent Labs Lab 06/26/16 2135 06/27/16 0655 06/27/16 1143  GLUCAP 160* 96 199*   Lipid Profile:  Recent Labs  06/27/16 0146  CHOL 168  HDL 49  LDLCALC 101*  TRIG 92  CHOLHDL 3.4   Thyroid Function Tests: No results for input(s): TSH, T4TOTAL, FREET4, T3FREE, THYROIDAB in the last 72 hours. Anemia Panel: No results for input(s): VITAMINB12, FOLATE, FERRITIN, TIBC, IRON, RETICCTPCT in the last 72 hours. Urine analysis: No results found for: COLORURINE, APPEARANCEUR, LABSPEC, PHURINE, GLUCOSEU, HGBUR, BILIRUBINUR, KETONESUR, PROTEINUR, UROBILINOGEN, NITRITE, LEUKOCYTESUR Sepsis Labs: @LABRCNTIP (procalcitonin:4,lacticidven:4)  )No results found for this or any previous visit (from the past 240 hour(s)).    Radiology Studies: Dg Chest 2 View  Result Date: 06/26/2016 CLINICAL DATA:  History of stroke. EXAM: CHEST  2 VIEW COMPARISON:  None. FINDINGS: The heart size and mediastinal contours are within normal limits. Both lungs are clear. The visualized skeletal structures are unremarkable. IMPRESSION: No active cardiopulmonary disease. Electronically Signed   By: Ted Mcalpine M.D.   On: 06/26/2016 22:20   Ct Head Wo Contrast  Result Date: 06/26/2016 CLINICAL DATA:  Right  eye vision changes.  No headache. EXAM: CT HEAD WITHOUT CONTRAST TECHNIQUE: Contiguous axial images were obtained from the base of the skull through the vertex without intravenous contrast. COMPARISON:  None. FINDINGS: Brain: No evidence of acute infarction, hemorrhage, hydrocephalus, extra-axial collection or mass lesion/mass effect. Vascular: No hyperdense vessel or unexpected calcification. Skull: No osseous abnormality. Sinuses/Orbits: Visualized paranasal sinuses are clear. Visualized mastoid sinuses are clear. Visualized orbits demonstrate no focal abnormality. Other: None IMPRESSION: No acute intracranial pathology. Electronically Signed   By: Elige Ko   On: 06/26/2016 12:48   Mr Brain Wo Contrast  Result Date: 06/26/2016 CLINICAL DATA:  Visual loss in the right eye. Suspected arterial occlusion. EXAM: MRI HEAD WITHOUT CONTRAST MRA HEAD WITHOUT CONTRAST TECHNIQUE: Multiplanar, multiecho pulse sequences of the brain and surrounding structures were obtained without intravenous contrast. Angiographic images of the head were obtained using MRA technique without contrast. COMPARISON:  Head CT 06/26/2016 FINDINGS: MRI HEAD FINDINGS Brain: No acute infarct or intraparenchymal hemorrhage. The midline structures are normal. No focal parenchymal signal abnormality. No mass lesion or midline shift. No hydrocephalus or extra-axial fluid collection. Vascular: Major intracranial arterial and venous sinus flow voids are preserved. No evidence of chronic microhemorrhage or amyloid angiopathy. Skull and upper cervical spine: The visualized skull base, calvarium, upper cervical spine and extracranial soft tissues are normal. Sinuses/Orbits: No fluid levels or advanced mucosal thickening. No mastoid effusion. Normal orbits. MRA HEAD FINDINGS Intracranial internal carotid arteries: Normal. Anterior cerebral arteries: Normal. Middle cerebral arteries: Normal. Posterior communicating arteries: Present on the left.  Posterior cerebral arteries: Normal. Basilar artery: Normal. Vertebral arteries: Codominant. Normal. Superior cerebellar arteries: Normal. Anterior inferior cerebellar arteries: Normal. Posterior inferior cerebellar arteries: Normal. IMPRESSION: Normal MRI/MRA of the brain and intracranial arteries. Electronically Signed   By: Deatra Robinson M.D.   On: 06/26/2016 23:04   Mr Maxine Glenn Head/brain ZO Cm  Result Date: 06/26/2016 CLINICAL DATA:  Visual loss in the right eye. Suspected arterial occlusion. EXAM: MRI HEAD WITHOUT CONTRAST MRA HEAD WITHOUT CONTRAST TECHNIQUE: Multiplanar, multiecho pulse sequences of the brain and surrounding structures were obtained without intravenous contrast. Angiographic images of the head were obtained using MRA technique without contrast. COMPARISON:  Head CT 06/26/2016 FINDINGS: MRI HEAD FINDINGS Brain: No acute infarct or intraparenchymal hemorrhage. The midline structures are normal. No focal parenchymal signal abnormality. No mass lesion or midline shift. No hydrocephalus or extra-axial fluid collection. Vascular: Major intracranial arterial and venous sinus flow voids are preserved. No evidence of chronic microhemorrhage or amyloid angiopathy. Skull and upper cervical spine: The visualized skull base, calvarium, upper cervical spine and extracranial soft tissues are normal. Sinuses/Orbits: No fluid levels or advanced mucosal thickening. No mastoid effusion. Normal orbits. MRA HEAD FINDINGS Intracranial internal carotid arteries: Normal. Anterior cerebral arteries: Normal. Middle cerebral arteries: Normal. Posterior communicating arteries: Present on the left. Posterior cerebral arteries: Normal. Basilar artery: Normal. Vertebral arteries: Codominant. Normal. Superior cerebellar arteries: Normal. Anterior inferior cerebellar arteries: Normal. Posterior inferior cerebellar arteries: Normal. IMPRESSION: Normal MRI/MRA of the brain and intracranial arteries. Electronically Signed   By:  Deatra Robinson M.D.   On: 06/26/2016 23:04     Scheduled Meds: .  stroke: mapping our early stages of recovery book   Does not apply Once  . atorvastatin  10 mg Oral q1800  . enoxaparin (LOVENOX) injection  40 mg Subcutaneous Q24H  . insulin aspart  0-5 Units Subcutaneous QHS  . insulin aspart  0-9 Units Subcutaneous TID WC  . insulin glargine  50 Units Subcutaneous QHS   Continuous Infusions:    LOS: 0 days   Time Spent in minutes   30 minutes  Wofford Stratton D.O. on 06/27/2016 at 12:14 PM  Between 7am to 7pm - Pager - [phone]  After 7pm go to [email or web address] - password [email or web address]  And look for the night coverage person covering for me after hours  [facility] Office  [phone]

## 2016-06-28 DIAGNOSIS — H539 Unspecified visual disturbance: Secondary | ICD-10-CM | POA: Diagnosis not present

## 2016-06-28 DIAGNOSIS — E785 Hyperlipidemia, unspecified: Secondary | ICD-10-CM

## 2016-06-28 DIAGNOSIS — E118 Type 2 diabetes mellitus with unspecified complications: Secondary | ICD-10-CM | POA: Diagnosis not present

## 2016-06-28 DIAGNOSIS — I10 Essential (primary) hypertension: Secondary | ICD-10-CM | POA: Diagnosis not present

## 2016-06-28 LAB — VAS US CAROTID
LCCADSYS: -82 cm/s
LCCAPSYS: 132 cm/s
LEFT ECA DIAS: -13 cm/s
LEFT VERTEBRAL DIAS: -19 cm/s
Left CCA dist dias: -17 cm/s
Left CCA prox dias: 27 cm/s
Left ICA dist dias: -37 cm/s
Left ICA dist sys: -83 cm/s
Left ICA prox dias: -20 cm/s
Left ICA prox sys: -69 cm/s
RCCADSYS: -78 cm/s
RCCAPDIAS: 23 cm/s
RCCAPSYS: 103 cm/s
RIGHT ECA DIAS: -17 cm/s
RIGHT VERTEBRAL DIAS: 21 cm/s

## 2016-06-28 LAB — GLUCOSE, CAPILLARY
Glucose-Capillary: 100 mg/dL — ABNORMAL HIGH (ref 65–99)
Glucose-Capillary: 176 mg/dL — ABNORMAL HIGH (ref 65–99)

## 2016-06-28 LAB — HEMOGLOBIN A1C
Hgb A1c MFr Bld: 8.4 % — ABNORMAL HIGH (ref 4.8–5.6)
MEAN PLASMA GLUCOSE: 194 mg/dL

## 2016-06-28 MED ORDER — ASPIRIN 325 MG PO TBEC: 325.0000 mg | DELAYED_RELEASE_TABLET | Freq: Every day | ORAL | 0 refills | Status: DC

## 2016-06-28 MED ORDER — ATORVASTATIN CALCIUM 10 MG PO TABS: 10.0000 mg | ORAL_TABLET | Freq: Every day | ORAL | 0 refills | Status: AC

## 2016-06-28 NOTE — Discharge Summary (Signed)
Physician Discharge Summary  Randy Benson RUE:[id number] DOB: 11/12/1970 DOA: 06/26/2016  PCP: No PCP Per Patient  Admit date: 06/26/2016 Discharge date: 06/28/2016  Time spent: 45 minutes  Recommendations for Outpatient Follow-up:  Patient will be discharged to home.  Patient will need to follow up with primary care provider within one week of discharge. Monitor liver and kidney function in 3-4 months (need labs that your PCP can order). Follow up with your ophthalmologist.  May follow up with Dr. Pearlean Brownie, neurologist, in 2 months.  Patient should continue medications as prescribed.  Patient should follow a heart healthy/carb modified diet.   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 It seems the patient started having blurry and decreased vision in his right eye approximately 2 days ago. He presented to his ophthalmologist, who performed a dilated exam and noted areas of arterial occlusion, and sent him to the emergency department for further workup. Patient states that his blood sugars have been better controlled and were normal this morning. He states his blood pressure has also been controlled with his medications. Patient denies any recent illness, fever, ill contacts, travel, chest pain, shortness of breath, abdominal pain, nausea or vomiting, diarrhea or constipation, dizziness or headache. Patient does endorse having some numbness and tingling in his feet and toes which has been ongoing for quite some time.   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 Spoke with Dr. Pearlean Brownie.  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  Vertebral artery flow is antegrade.  -Lipid panel: Total cholesterol 168, triglycerides 92, HDL 49, LDL 101 -PT, OT consulted, no further therapy needed.  -Continue aspirin and statin -Follow up with ophthalmologist -Patient may follow up with Dr. Pearlean Brownie in 2 months  Essential hypertension -Held amlodipine/valsartan/HCTZ, may restart upon discharge -Allowed for permissive hypertension -BP has been normotensive   Diabetes mellitus, type II -Hemoglobin A1c ordered -Continue Lantus, ISS and CBG monitoring  -Held metformin, may restart upon discharge  Hyperlipidemia -Started on statin -Follow LFTS in 3-4 months  Consultants Neurology  Procedures  Echocardiogram Carotid doppler LE doppler  Discharge Exam: Vitals:   06/28/16 0530 06/28/16 0950  BP: 136/84 134/81  Pulse: 73 80  Resp: 20 20  Temp: 97.6 F (36.4 C) 97.6 F (36.4 C)   Exam  General: Well developed, well nourished, NAD, appears stated age  HEENT: NCAT,  mucous membranes moist.   Cardiovascular: S1 S2 auscultated, no rubs, murmurs or gallops. Regular rate and rhythm.  Respiratory: Clear to auscultation bilaterally with equal chest rise  Abdomen: Soft, nontender, nondistended, + bowel sounds  Extremities: warm dry without cyanosis clubbing or edema  Neuro: AAOx3, Right central vision loss, otherwise nonfocal  Psych: Normal affect and  demeanor with intact  judgement and insight, pleasant  Discharge Instructions Discharge Instructions    Discharge instructions    Complete by:  As directed    Patient will be discharged to home.  Patient will need to follow up with primary care provider within one week of discharge. Monitor liver and kidney function in 3-4 months (need labs that your PCP can order). Follow up with your ophthalmologist.  May follow up with Dr. Pearlean BrownieSethi, neurologist, in 2 months.  Patient should continue medications as prescribed.  Patient should follow a heart healthy/carb modified diet.     Current Discharge Medication List    START taking these medications   Details  aspirin EC 325 MG EC tablet Take 1 tablet (325 mg total) by mouth daily. Qty: 30 tablet, Refills: 0    atorvastatin (LIPITOR) 10 MG tablet Take 1 tablet (10 mg total) by mouth daily at 6 PM. Qty: 30 tablet, Refills: 0      CONTINUE these medications which have NOT CHANGED   Details  Amlodipine-Valsartan-HCTZ 10-320-25 MG TABS Take 1 tablet by mouth daily.    insulin glargine (LANTUS) 100 UNIT/ML injection Inject 50 Units into the skin at bedtime.    metFORMIN (GLUCOPHAGE) 1000 MG tablet Take 1,000 mg by mouth 2 (two) times daily with a meal.       No Known Allergies Follow-up Information    Primary care physician. Schedule an appointment as soon as possible for a visit in 1 week(s).   Why:  Hospital follow up       SETHI,PRAMOD, MD. Schedule an appointment as soon as possible for a visit in 1 week(s).   Specialties:  Neurology, Radiology Why:  Hospital follow up, stroke clinic Contact information: [address] [place] [address] [phone]            The results of significant diagnostics from this hospitalization (including imaging, microbiology, ancillary and laboratory) are listed below for reference.    Significant Diagnostic Studies: Dg Chest 2 View  Result Date: 06/26/2016 CLINICAL DATA:  History of stroke. EXAM:  CHEST  2 VIEW COMPARISON:  None. FINDINGS: The heart size and mediastinal contours are within normal limits. Both lungs are clear. The visualized skeletal structures are unremarkable. IMPRESSION: No active cardiopulmonary disease. Electronically Signed   By: Ted Mcalpineobrinka  Dimitrova M.D.   On: 06/26/2016 22:20   Ct Head Wo Contrast  Result Date: 06/26/2016 CLINICAL DATA:  Right eye vision changes.  No headache. EXAM: CT HEAD WITHOUT CONTRAST TECHNIQUE: Contiguous axial images were obtained from the base of the skull through the vertex without intravenous contrast. COMPARISON:  None. FINDINGS: Brain: No evidence of acute infarction, hemorrhage, hydrocephalus, extra-axial collection or mass lesion/mass effect. Vascular: No hyperdense vessel or unexpected calcification. Skull: No osseous abnormality. Sinuses/Orbits: Visualized paranasal sinuses are clear. Visualized mastoid sinuses are clear. Visualized orbits demonstrate no focal abnormality. Other: None IMPRESSION: No acute intracranial pathology. Electronically Signed   By: Elige KoHetal  Patel   On: 06/26/2016 12:48   Mr Brain Wo Contrast  Result Date: 06/26/2016 CLINICAL DATA:  Visual loss in the right eye. Suspected arterial occlusion. EXAM: MRI HEAD WITHOUT CONTRAST MRA HEAD WITHOUT CONTRAST TECHNIQUE: Multiplanar, multiecho pulse sequences of the brain and surrounding structures were obtained without intravenous contrast. Angiographic images of the head were obtained using MRA technique without contrast. COMPARISON:  Head CT 06/26/2016 FINDINGS: MRI HEAD FINDINGS Brain: No acute infarct or intraparenchymal hemorrhage. The midline structures are normal. No focal parenchymal signal abnormality.  No mass lesion or midline shift. No hydrocephalus or extra-axial fluid collection. Vascular: Major intracranial arterial and venous sinus flow voids are preserved. No evidence of chronic microhemorrhage or amyloid angiopathy. Skull and upper cervical spine: The visualized skull  base, calvarium, upper cervical spine and extracranial soft tissues are normal. Sinuses/Orbits: No fluid levels or advanced mucosal thickening. No mastoid effusion. Normal orbits. MRA HEAD FINDINGS Intracranial internal carotid arteries: Normal. Anterior cerebral arteries: Normal. Middle cerebral arteries: Normal. Posterior communicating arteries: Present on the left. Posterior cerebral arteries: Normal. Basilar artery: Normal. Vertebral arteries: Codominant. Normal. Superior cerebellar arteries: Normal. Anterior inferior cerebellar arteries: Normal. Posterior inferior cerebellar arteries: Normal. IMPRESSION: Normal MRI/MRA of the brain and intracranial arteries. Electronically Signed   By: Deatra Robinson M.D.   On: 06/26/2016 23:04   Mr Maxine Glenn Head/brain WJ Cm  Result Date: 06/26/2016 CLINICAL DATA:  Visual loss in the right eye. Suspected arterial occlusion. EXAM: MRI HEAD WITHOUT CONTRAST MRA HEAD WITHOUT CONTRAST TECHNIQUE: Multiplanar, multiecho pulse sequences of the brain and surrounding structures were obtained without intravenous contrast. Angiographic images of the head were obtained using MRA technique without contrast. COMPARISON:  Head CT 06/26/2016 FINDINGS: MRI HEAD FINDINGS Brain: No acute infarct or intraparenchymal hemorrhage. The midline structures are normal. No focal parenchymal signal abnormality. No mass lesion or midline shift. No hydrocephalus or extra-axial fluid collection. Vascular: Major intracranial arterial and venous sinus flow voids are preserved. No evidence of chronic microhemorrhage or amyloid angiopathy. Skull and upper cervical spine: The visualized skull base, calvarium, upper cervical spine and extracranial soft tissues are normal. Sinuses/Orbits: No fluid levels or advanced mucosal thickening. No mastoid effusion. Normal orbits. MRA HEAD FINDINGS Intracranial internal carotid arteries: Normal. Anterior cerebral arteries: Normal. Middle cerebral arteries: Normal. Posterior  communicating arteries: Present on the left. Posterior cerebral arteries: Normal. Basilar artery: Normal. Vertebral arteries: Codominant. Normal. Superior cerebellar arteries: Normal. Anterior inferior cerebellar arteries: Normal. Posterior inferior cerebellar arteries: Normal. IMPRESSION: Normal MRI/MRA of the brain and intracranial arteries. Electronically Signed   By: Deatra Robinson M.D.   On: 06/26/2016 23:04    Microbiology: No results found for this or any previous visit (from the past 240 hour(s)).   Labs: Basic Metabolic Panel:  Recent Labs Lab 06/26/16 1150 06/26/16 1201  NA 138 140  K 4.5 4.4  CL 101 100*  CO2 27  --   GLUCOSE 108* 105*  BUN 10 14  CREATININE 0.91 0.90  CALCIUM 10.1  --    Liver Function Tests:  Recent Labs Lab 06/26/16 1150  AST 32  ALT 16*  ALKPHOS 59  BILITOT 1.2  PROT 7.1  ALBUMIN 4.7   No results for input(s): LIPASE, AMYLASE in the last 168 hours. No results for input(s): AMMONIA in the last 168 hours. CBC:  Recent Labs Lab 06/26/16 1150 06/26/16 1201  WBC 5.7  --   NEUTROABS 2.4  --   HGB 15.2 16.3  HCT 46.6 48.0  MCV 92.6  --   PLT 184  --    Cardiac Enzymes: No results for input(s): CKTOTAL, CKMB, CKMBINDEX, TROPONINI in the last 168 hours. BNP: BNP (last 3 results) No results for input(s): BNP in the last 8760 hours.  ProBNP (last 3 results) No results for input(s): PROBNP in the last 8760 hours.  CBG:  Recent Labs Lab 06/27/16 0655 06/27/16 1143 06/27/16 1637 06/27/16 2107 06/28/16 0623  GLUCAP 96 199* 98 139* 100*       Signed:  Edsel Petrin  [facility]  06/28/2016, 10:22 AM

## 2016-06-28 NOTE — Discharge Instructions (Signed)
Visual Disturbances °You have had a disturbance in your vision. This may be caused by various conditions, such as: °· Migraines. Migraine headaches are often preceded by a disturbance in vision. Blind spots or light flashes are followed by a headache. This type of visual disturbance is temporary. It does not damage the eye. °· Glaucoma. This is caused by increased pressure in the eye. Symptoms include haziness, blurred vision, or seeing rainbow colored circles when looking at bright lights. Partial or complete visual loss can occur. You may or may not experience eye pain. Visual loss may be gradual or sudden and is irreversible. Glaucoma is the leading cause of blindness. °· Retina problems. Vision will be reduced if the retina becomes detached or if there is a circulation problem as with diabetes, high blood pressure, or a mini-stroke. Symptoms include seeing "floaters," flashes of light, or shadows, as if a curtain has fallen over your eye. °· Optic nerve problems. The main nerve in your eye can be damaged by redness, soreness, and swelling (inflammation), poor circulation, drugs, and toxins. °It is very important to have a complete exam done by a specialist to determine the exact cause of your eye problem. The specialist may recommend medicines or surgery, depending on the cause of the problem. This can help prevent further loss of vision or reduce the risk of having a stroke. Contact the caregiver to whom you have been referred and arrange for follow-up care right away. °SEEK IMMEDIATE MEDICAL CARE IF:  °· Your vision gets worse. °· You develop severe headaches. °· You have any weakness or numbness in the face, arms, or legs. °· You have any trouble speaking or walking. °  °This information is not intended to replace advice given to you by your health care provider. Make sure you discuss any questions you have with your health care provider. °  °Document Released: 10/15/2004 Document Revised: 11/30/2011 Document  Reviewed: 02/14/2014 °Elsevier Interactive Patient Education ©2016 [employer] ° °

## 2016-06-28 NOTE — Progress Notes (Signed)
STROKE TEAM PROGRESS NOTE   HISTORY OF PRESENT ILLNESS (per record) Randy Benson is an 45 y.o. male with hypertension and diabetes. Patient had noticed blurred vision in his right eye and right visual field for the last 2 days. Patient admitted appointment to see his ophthalmologist to did a dilated pupil exam and noted that he had to areas of embolic strokes in the retina. Patient was sent to the ED for further stroke evaluation. Currently patient has no other symptoms other than mild blurred vision in the central aspect of of his vision of only the right eye. He states that he does take aspirin on a daily basis.  Date last known well: Date: 06/25/2016 Time last known well: Unable to determine tPA Given: No: Patient is outside the window.   SUBJECTIVE (INTERVAL HISTORY) The patient's wife is at the bedside. The patient reports she is feeling better. LE venous dopplers negative for DVT and  2DEcho normal  OBJECTIVE Temp:  [97.6 F (36.4 C)-99.1 F (37.3 C)] 97.6 F (36.4 C) (10/08 0950) Pulse Rate:  [73-82] 80 (10/08 0950) Cardiac Rhythm: Normal sinus rhythm (10/08 0700) Resp:  [18-20] 20 (10/08 0950) BP: (128-141)/(80-99) 134/81 (10/08 0950) SpO2:  [96 %-100 %] 99 % (10/08 0950)  CBC:   Recent Labs Lab 06/26/16 1150 06/26/16 1201  WBC 5.7  --   NEUTROABS 2.4  --   HGB 15.2 16.3  HCT 46.6 48.0  MCV 92.6  --   PLT 184  --     Basic Metabolic Panel:   Recent Labs Lab 06/26/16 1150 06/26/16 1201  NA 138 140  K 4.5 4.4  CL 101 100*  CO2 27  --   GLUCOSE 108* 105*  BUN 10 14  CREATININE 0.91 0.90  CALCIUM 10.1  --     Lipid Panel:     Component Value Date/Time   CHOL 168 06/27/2016 0146   TRIG 92 06/27/2016 0146   HDL 49 06/27/2016 0146   CHOLHDL 3.4 06/27/2016 0146   VLDL 18 06/27/2016 0146   LDLCALC 101 (H) 06/27/2016 0146   HgbA1c:  Lab Results  Component Value Date   HGBA1C 8.4 (H) 06/27/2016   Urine Drug Screen: No results found for: LABOPIA,  COCAINSCRNUR, LABBENZ, AMPHETMU, THCU, LABBARB    IMAGING  Dg Chest 2 View 06/26/2016 No active cardiopulmonary disease.   Ct Head Wo Contrast 06/26/2016 No acute intracranial pathology.   Mr Maxine GlennMra Head/brain Wo Cm 06/26/2016 Normal MRI/MRA of the brain and intracranial arteries.     PHYSICAL EXAM HEENT-  Normocephalic, no lesions, without obvious abnormality.  Normal external eye and conjunctiva.  Normal TM's bilaterally.  Normal auditory canals and external ears. Normal external nose, mucus membranes and septum.  Normal pharynx. Cardiovascular- S1, S2 normal, pulses palpable throughout   Lungs- chest clear, no wheezing, rales, normal symmetric air entry Abdomen- normal findings: bowel sounds normal Extremities- no edema Lymph-no adenopathy palpable Musculoskeletal-no joint tenderness, deformity or swelling Skin-warm and dry, no hyperpigmentation, vitiligo, or suspicious lesions  Neurological Examination Mental Status: Alert, oriented, thought content appropriate.  Speech fluent without evidence of aphasia.  Able to follow 3 step commands without difficulty. Cranial Nerves: II:  Visual fields grossly normal however once the left eye is covered patient states that he has decreased vision in the central and superior quadrant of his vision. pupils equal, round, reactive to light and accommodation III,IV, VI: ptosis not present, extra-ocular motions intact bilaterally V,VII: smile symmetric, facial light touch sensation normal bilaterally  VIII: hearing normal bilaterally IX,X: uvula rises symmetrically XI: bilateral shoulder shrug XII: midline tongue extension Motor: Right :  Upper extremity   5/5                                      Left:     Upper extremity   5/5             Lower extremity   5/5                                                  Lower extremity   5/5 Tone and bulk:normal tone throughout; no atrophy noted Sensory: Pinprick and light touch intact throughout,  bilaterally Deep Tendon Reflexes: 2+ and symmetric throughout Plantars: Right: downgoing                                Left: downgoing Cerebellar: normal finger-to-nose, and normal heel-to-shin test Gait: Not Tested    ASSESSMENT/PLAN Mr. Randy Benson is a 45 y.o. male with history of diabetes mellitus and hypertension presenting with visual changes. Marland KitchenHe did not receive IV t-PA due to the presentation.  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  Vertebral artery flow is antegrade.  Lower extremity duplex -negative for DVT  2D Echo  EF 55-60%. No cardiac source of emboli identified.  LDL - 101  HgbA1c 8.4  VTE prophylaxis -  Lovenox Diet heart healthy/carb modified Room service appropriate? Yes; Fluid consistency: Thin  No antithrombotic prior to admission, now on No antithrombotic - will order aspirin 325 mg daily  Patient counseled to be compliant with his antithrombotic medications  Ongoing aggressive stroke risk factor management  Therapy recommendations:  No follow-up therapies recommended  Disposition:  Pending  Hypertension  Stable  Permissive hypertension (OK if < 220/120) but gradually normalize in 5-7 days  Long-term BP goal normotensive  Hyperlipidemia  Home meds: No lipid lowering medications prior to admission  LDL 101, goal < 70  Now on Lipitor 10 mg daily. Suspect he will need a higher dose.  Continue statin at discharge  Diabetes  HgbA1c pending, goal < 7.0  Controlled  Other Stroke Risk Factors  Obesity, Body mass index is 29.29 kg/m., recommend weight loss, diet and exercise as appropriate    Other Active Problems  Hospital day # 1  Delton See PA-C [facility] Pager [phone] 06/28/2016, 2:08 PM I have personally examined this patient,  reviewed notes, independently viewed imaging studies, participated in medical decision making and plan of care.ROS completed by me personally and pertinent positives fully documented  I have made any additions or clarifications directly to the above note. Agree with note above.  .Long d/w patient, wife and Dr Catha Gosselin. Discharge patient home on aspirin and follows with ophthalmologist for his vision acuity. Stroke Delia Heady, MD Medical Director [facility] Pager: [phone] 06/28/2016 2:08 PM   To contact Stroke Continuity provider, please refer to [email or web address]. After hours, contact General Neurology

## 2016-07-27 ENCOUNTER — Other Ambulatory Visit (INDEPENDENT_AMBULATORY_CARE_PROVIDER_SITE_OTHER): Payer: Managed Care, Other (non HMO) | Admitting: Ophthalmology

## 2016-07-27 DIAGNOSIS — E113512 Type 2 diabetes mellitus with proliferative diabetic retinopathy with macular edema, left eye: Secondary | ICD-10-CM | POA: Diagnosis not present

## 2016-07-27 DIAGNOSIS — E11311 Type 2 diabetes mellitus with unspecified diabetic retinopathy with macular edema: Secondary | ICD-10-CM

## 2016-08-06 ENCOUNTER — Other Ambulatory Visit (INDEPENDENT_AMBULATORY_CARE_PROVIDER_SITE_OTHER): Payer: Managed Care, Other (non HMO) | Admitting: Ophthalmology

## 2016-08-06 DIAGNOSIS — E11311 Type 2 diabetes mellitus with unspecified diabetic retinopathy with macular edema: Secondary | ICD-10-CM | POA: Diagnosis not present

## 2016-08-06 DIAGNOSIS — E113511 Type 2 diabetes mellitus with proliferative diabetic retinopathy with macular edema, right eye: Secondary | ICD-10-CM | POA: Diagnosis not present

## 2016-12-07 ENCOUNTER — Ambulatory Visit (INDEPENDENT_AMBULATORY_CARE_PROVIDER_SITE_OTHER): Payer: Managed Care, Other (non HMO) | Admitting: Ophthalmology

## 2016-12-07 DIAGNOSIS — I10 Essential (primary) hypertension: Secondary | ICD-10-CM | POA: Diagnosis not present

## 2016-12-07 DIAGNOSIS — H43813 Vitreous degeneration, bilateral: Secondary | ICD-10-CM | POA: Diagnosis not present

## 2016-12-07 DIAGNOSIS — E11319 Type 2 diabetes mellitus with unspecified diabetic retinopathy without macular edema: Secondary | ICD-10-CM

## 2016-12-07 DIAGNOSIS — E113593 Type 2 diabetes mellitus with proliferative diabetic retinopathy without macular edema, bilateral: Secondary | ICD-10-CM | POA: Diagnosis not present

## 2016-12-07 DIAGNOSIS — H35033 Hypertensive retinopathy, bilateral: Secondary | ICD-10-CM | POA: Diagnosis not present

## 2017-06-11 ENCOUNTER — Ambulatory Visit (INDEPENDENT_AMBULATORY_CARE_PROVIDER_SITE_OTHER): Payer: Managed Care, Other (non HMO) | Admitting: Ophthalmology

## 2018-03-21 ENCOUNTER — Encounter (INDEPENDENT_AMBULATORY_CARE_PROVIDER_SITE_OTHER): Payer: Self-pay | Admitting: Ophthalmology

## 2018-03-23 ENCOUNTER — Encounter (INDEPENDENT_AMBULATORY_CARE_PROVIDER_SITE_OTHER): Payer: Self-pay | Admitting: Ophthalmology

## 2018-05-02 ENCOUNTER — Encounter (INDEPENDENT_AMBULATORY_CARE_PROVIDER_SITE_OTHER): Payer: Self-pay | Admitting: Ophthalmology

## 2018-06-28 IMAGING — MR MR HEAD W/O CM
9 of 11 series · 31 of 48 positions shown · non-contrast
Comparison: Head CT 06/26/2016

CLINICAL DATA: Visual loss in the right eye. Suspected arterial
occlusion.

EXAM:
MRI HEAD WITHOUT CONTRAST
MRA HEAD WITHOUT CONTRAST
TECHNIQUE: Multiplanar, multiecho pulse sequences of the brain and surrounding
structures were obtained without intravenous contrast. Angiographic
images of the head were obtained using MRA technique without
contrast.

[Series 3: DWI · axial · 3.0mm · 0.94mm/px · z∈[-50,+88]mm · 7 of 96 slices shown (1 of 2)]
[im 1/96]
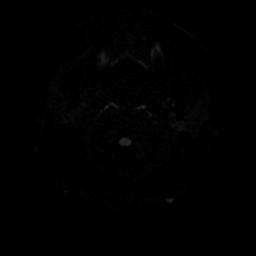
[im 16/96]
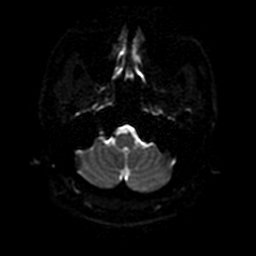
[im 32/96]
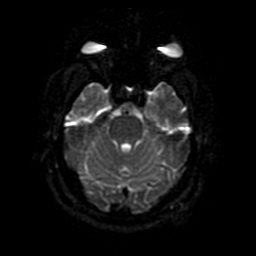
[im 48/96]
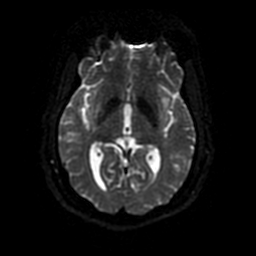
[im 64/96]
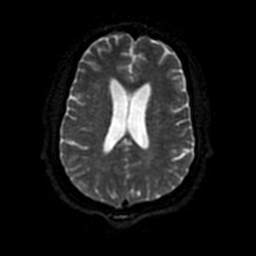
[im 80/96]
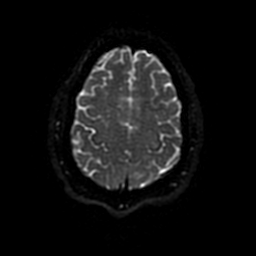
[im 96/96]
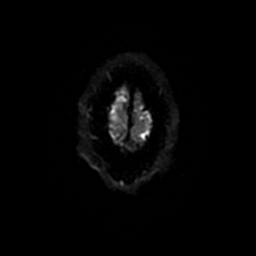

[Series 4: ax (id) 2 · axial · 1.0mm · 0.43mm/px · z∈[-37,+23]mm · 6 of 184 slices shown]
[im 1/184]
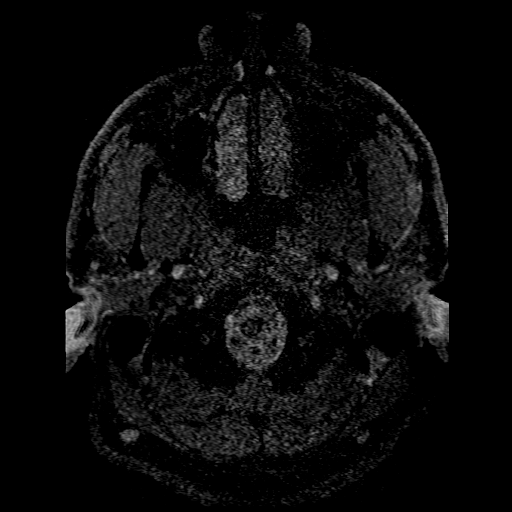
[im 31/184]
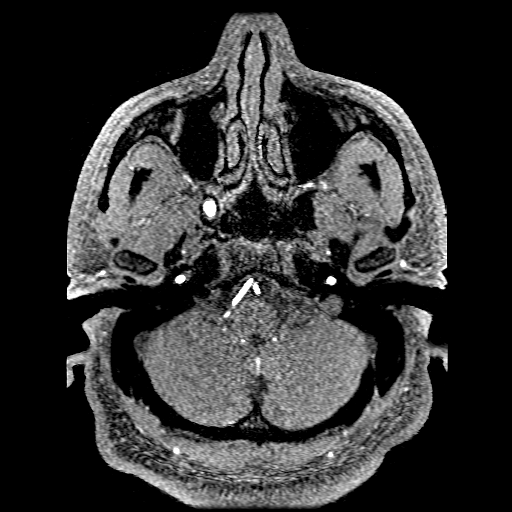
[im 62/184]
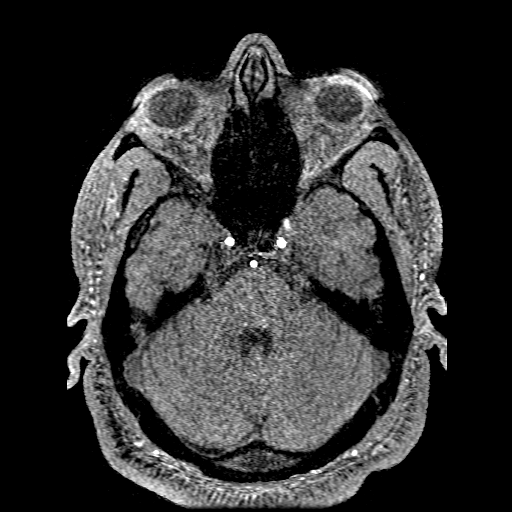
[im 77/184]
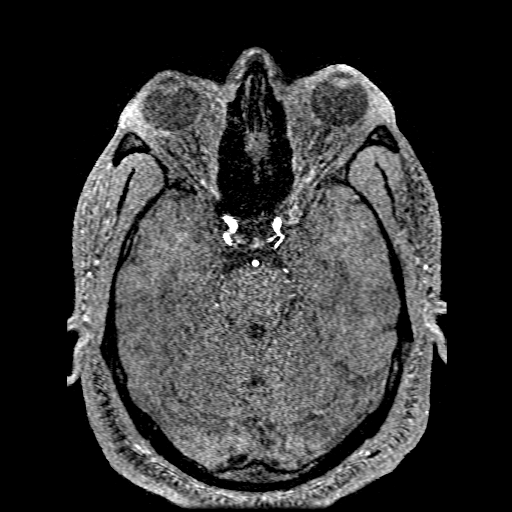
[im 107/184]
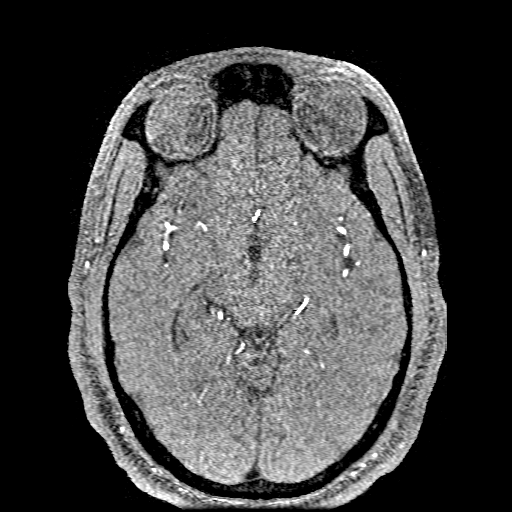
[im 123/184]
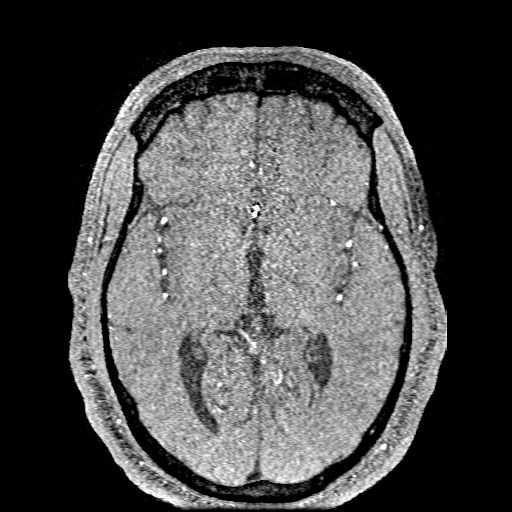

[Series 5: DWI · coronal · 4.0mm · 0.94mm/px · 5 of 70 slices shown (2 of 2)]
[im 1/70]
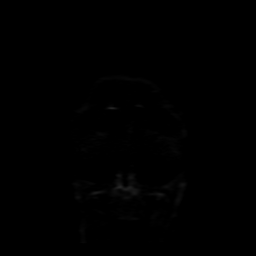
[im 18/70]
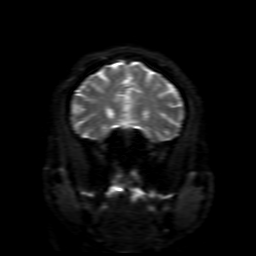
[im 35/70]
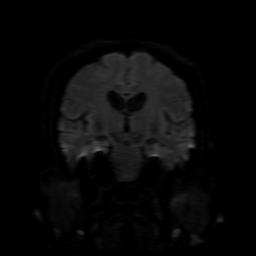
[im 52/70]
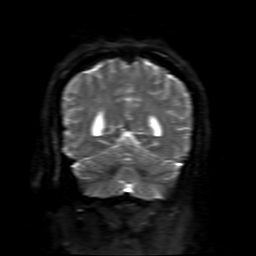
[im 70/70]
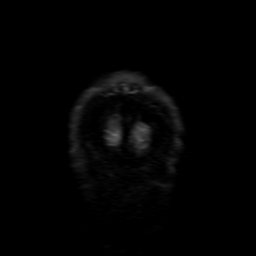

[Series 6: T2 · axial · 5.0mm · 0.47mm/px · z∈[-49,+87]mm · 2 of 24 slices shown (1 of 2)]
[im 1/24]
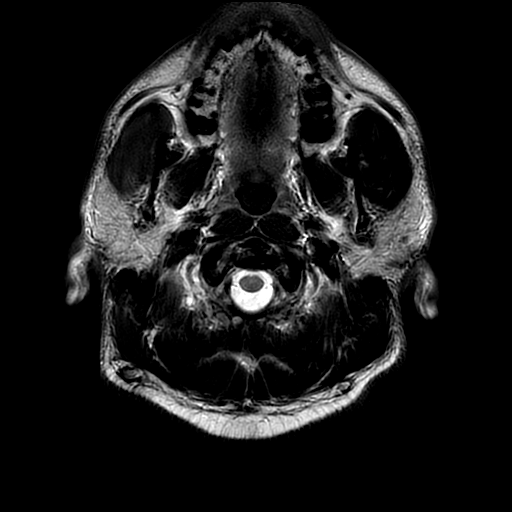
[im 24/24]
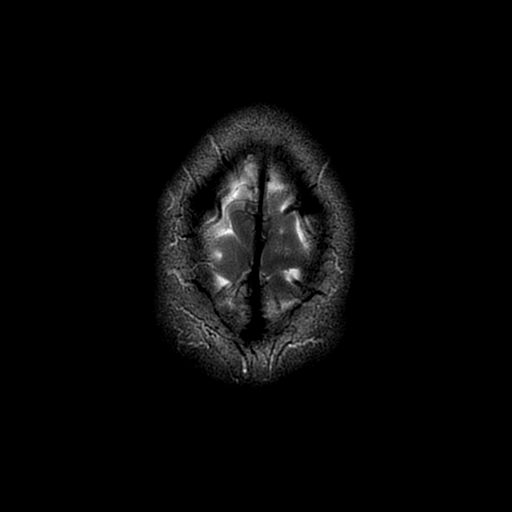

[Series 8: FLAIR · axial · 5.0mm · 0.47mm/px · z∈[-49,+87]mm · 2 of 24 slices shown (1 of 2)]
[im 1/24]
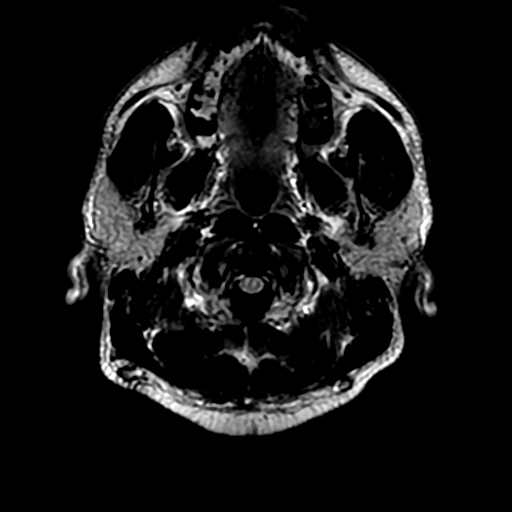
[im 24/24]
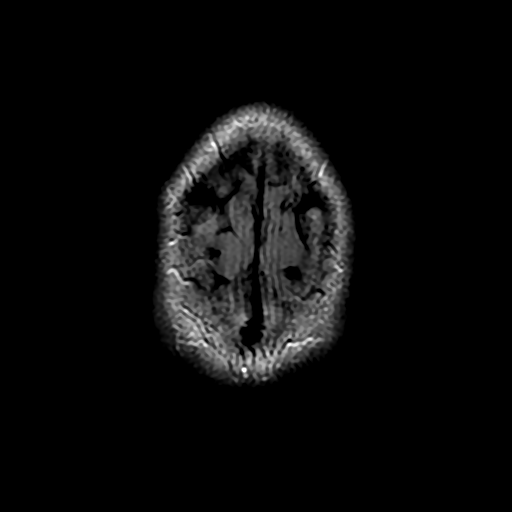

[Series 10: FLAIR · sagittal · 5.0mm · 0.51mm/px · 2 of 23 slices shown (2 of 2)]
[im 1/23]
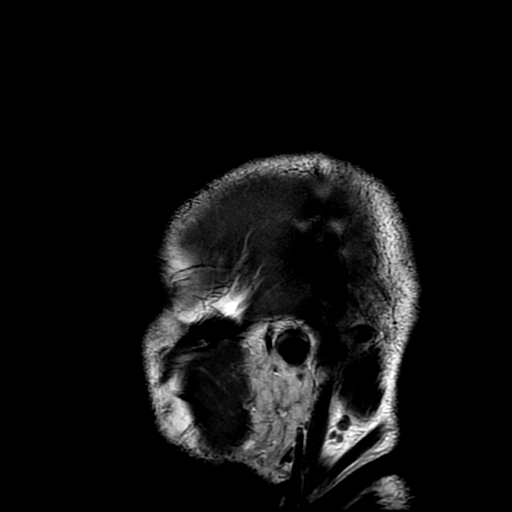
[im 23/23]
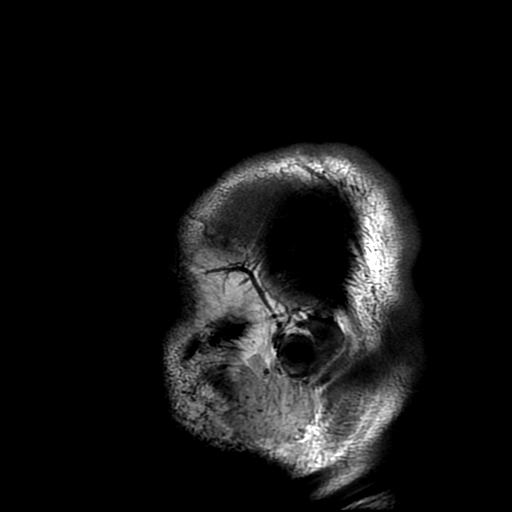

[Series 11: T2 · coronal · 5.0mm · 0.39mm/px · 2 of 29 slices shown (2 of 2)]
[im 1/29]
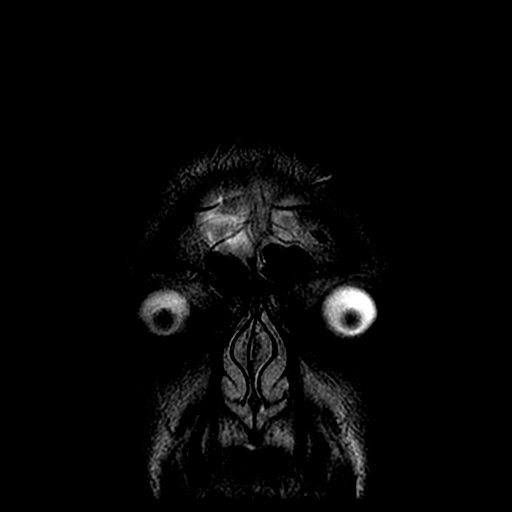
[im 29/29]
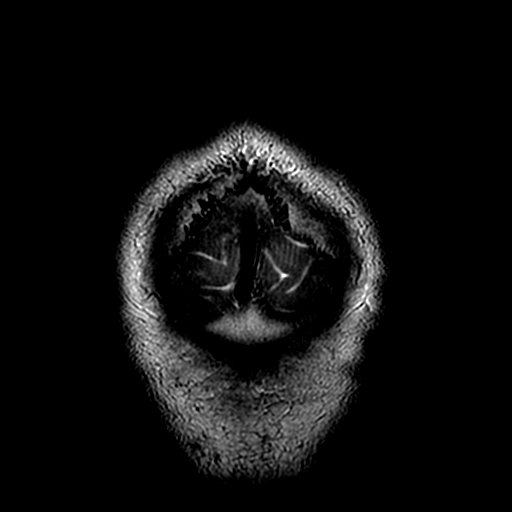

[Series 350: ADC · axial · 3.0mm · 0.94mm/px · z∈[-50,+88]mm · 3 of 48 slices shown (1 of 2)]
[im 1/48]
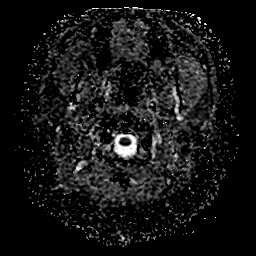
[im 24/48]
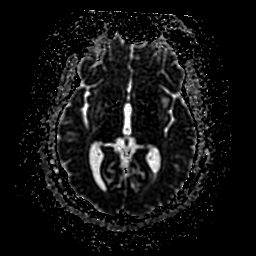
[im 48/48]
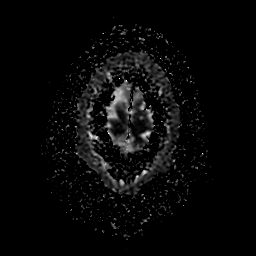

[Series 550: ADC · coronal · 4.0mm · 0.94mm/px · 2 of 35 slices shown (2 of 2)]
[im 1/35]
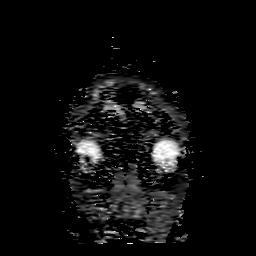
[im 35/35]
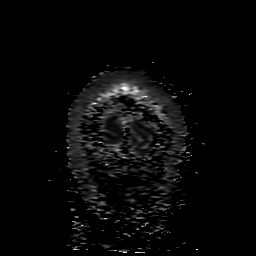

[31 of 48 positions shown; findings below may reference images not displayed]

FINDINGS: MRI HEAD FINDINGS

Brain: No acute infarct or intraparenchymal hemorrhage. The midline
structures are normal. No focal parenchymal signal abnormality. No
mass lesion or midline shift. No hydrocephalus or extra-axial fluid
collection.

Vascular: Major intracranial arterial and venous sinus flow voids
are preserved. No evidence of chronic microhemorrhage or amyloid
angiopathy.

Skull and upper cervical spine: The visualized skull base,
calvarium, upper cervical spine and extracranial soft tissues are
normal.

Sinuses/Orbits: No fluid levels or advanced mucosal thickening. No
mastoid effusion. Normal orbits.

MRA HEAD FINDINGS

Intracranial internal carotid arteries: Normal.

Anterior cerebral arteries: Normal.

Middle cerebral arteries: Normal.

Posterior communicating arteries: Present on the left.

Posterior cerebral arteries: Normal.

Basilar artery: Normal.

Vertebral arteries: Codominant. Normal.

Superior cerebellar arteries: Normal.

Anterior inferior cerebellar arteries: Normal.

Posterior inferior cerebellar arteries: Normal.
IMPRESSION: Normal MRI/MRA of the brain and intracranial arteries.

## 2018-07-04 ENCOUNTER — Encounter (INDEPENDENT_AMBULATORY_CARE_PROVIDER_SITE_OTHER): Payer: Self-pay | Admitting: Ophthalmology

## 2018-07-11 ENCOUNTER — Encounter (INDEPENDENT_AMBULATORY_CARE_PROVIDER_SITE_OTHER): Payer: PRIVATE HEALTH INSURANCE | Admitting: Ophthalmology

## 2018-07-11 DIAGNOSIS — I10 Essential (primary) hypertension: Secondary | ICD-10-CM | POA: Diagnosis not present

## 2018-07-11 DIAGNOSIS — E113512 Type 2 diabetes mellitus with proliferative diabetic retinopathy with macular edema, left eye: Secondary | ICD-10-CM | POA: Diagnosis not present

## 2018-07-11 DIAGNOSIS — H43813 Vitreous degeneration, bilateral: Secondary | ICD-10-CM

## 2018-07-11 DIAGNOSIS — H34231 Retinal artery branch occlusion, right eye: Secondary | ICD-10-CM

## 2018-07-11 DIAGNOSIS — E113591 Type 2 diabetes mellitus with proliferative diabetic retinopathy without macular edema, right eye: Secondary | ICD-10-CM

## 2018-07-11 DIAGNOSIS — H35033 Hypertensive retinopathy, bilateral: Secondary | ICD-10-CM

## 2018-07-11 DIAGNOSIS — E11311 Type 2 diabetes mellitus with unspecified diabetic retinopathy with macular edema: Secondary | ICD-10-CM | POA: Diagnosis not present

## 2018-08-08 ENCOUNTER — Encounter (INDEPENDENT_AMBULATORY_CARE_PROVIDER_SITE_OTHER): Payer: PRIVATE HEALTH INSURANCE | Admitting: Ophthalmology

## 2018-08-08 DIAGNOSIS — I10 Essential (primary) hypertension: Secondary | ICD-10-CM

## 2018-08-08 DIAGNOSIS — H4311 Vitreous hemorrhage, right eye: Secondary | ICD-10-CM

## 2018-08-08 DIAGNOSIS — E113512 Type 2 diabetes mellitus with proliferative diabetic retinopathy with macular edema, left eye: Secondary | ICD-10-CM

## 2018-08-08 DIAGNOSIS — H35033 Hypertensive retinopathy, bilateral: Secondary | ICD-10-CM

## 2018-08-08 DIAGNOSIS — E11311 Type 2 diabetes mellitus with unspecified diabetic retinopathy with macular edema: Secondary | ICD-10-CM

## 2018-08-08 DIAGNOSIS — E113591 Type 2 diabetes mellitus with proliferative diabetic retinopathy without macular edema, right eye: Secondary | ICD-10-CM | POA: Diagnosis not present

## 2018-08-08 DIAGNOSIS — H43813 Vitreous degeneration, bilateral: Secondary | ICD-10-CM

## 2018-08-08 DIAGNOSIS — H34231 Retinal artery branch occlusion, right eye: Secondary | ICD-10-CM

## 2018-08-08 DIAGNOSIS — H2513 Age-related nuclear cataract, bilateral: Secondary | ICD-10-CM

## 2018-08-16 ENCOUNTER — Encounter (INDEPENDENT_AMBULATORY_CARE_PROVIDER_SITE_OTHER): Payer: PRIVATE HEALTH INSURANCE | Admitting: Ophthalmology

## 2018-08-16 DIAGNOSIS — E113511 Type 2 diabetes mellitus with proliferative diabetic retinopathy with macular edema, right eye: Secondary | ICD-10-CM | POA: Diagnosis not present

## 2018-08-16 DIAGNOSIS — E11311 Type 2 diabetes mellitus with unspecified diabetic retinopathy with macular edema: Secondary | ICD-10-CM

## 2018-09-05 ENCOUNTER — Encounter (INDEPENDENT_AMBULATORY_CARE_PROVIDER_SITE_OTHER): Payer: PRIVATE HEALTH INSURANCE | Admitting: Ophthalmology

## 2018-09-22 ENCOUNTER — Encounter (INDEPENDENT_AMBULATORY_CARE_PROVIDER_SITE_OTHER): Payer: Self-pay

## 2018-09-22 ENCOUNTER — Encounter (INDEPENDENT_AMBULATORY_CARE_PROVIDER_SITE_OTHER): Payer: PRIVATE HEALTH INSURANCE | Admitting: Ophthalmology

## 2018-10-03 ENCOUNTER — Encounter (INDEPENDENT_AMBULATORY_CARE_PROVIDER_SITE_OTHER): Payer: PRIVATE HEALTH INSURANCE | Admitting: Ophthalmology

## 2018-10-03 DIAGNOSIS — E103591 Type 1 diabetes mellitus with proliferative diabetic retinopathy without macular edema, right eye: Secondary | ICD-10-CM

## 2018-10-03 DIAGNOSIS — E10311 Type 1 diabetes mellitus with unspecified diabetic retinopathy with macular edema: Secondary | ICD-10-CM

## 2018-10-03 DIAGNOSIS — I10 Essential (primary) hypertension: Secondary | ICD-10-CM

## 2018-10-03 DIAGNOSIS — H4313 Vitreous hemorrhage, bilateral: Secondary | ICD-10-CM | POA: Diagnosis not present

## 2018-10-03 DIAGNOSIS — H35033 Hypertensive retinopathy, bilateral: Secondary | ICD-10-CM

## 2018-10-03 DIAGNOSIS — E103512 Type 1 diabetes mellitus with proliferative diabetic retinopathy with macular edema, left eye: Secondary | ICD-10-CM | POA: Diagnosis not present

## 2018-10-21 ENCOUNTER — Encounter (INDEPENDENT_AMBULATORY_CARE_PROVIDER_SITE_OTHER): Payer: PRIVATE HEALTH INSURANCE | Admitting: Ophthalmology

## 2018-10-21 DIAGNOSIS — H4313 Vitreous hemorrhage, bilateral: Secondary | ICD-10-CM | POA: Diagnosis not present

## 2018-10-21 DIAGNOSIS — E11319 Type 2 diabetes mellitus with unspecified diabetic retinopathy without macular edema: Secondary | ICD-10-CM

## 2018-10-21 DIAGNOSIS — E113593 Type 2 diabetes mellitus with proliferative diabetic retinopathy without macular edema, bilateral: Secondary | ICD-10-CM

## 2018-10-21 DIAGNOSIS — H43813 Vitreous degeneration, bilateral: Secondary | ICD-10-CM

## 2018-10-21 DIAGNOSIS — H35033 Hypertensive retinopathy, bilateral: Secondary | ICD-10-CM

## 2018-10-21 DIAGNOSIS — I10 Essential (primary) hypertension: Secondary | ICD-10-CM

## 2018-10-21 DIAGNOSIS — H4312 Vitreous hemorrhage, left eye: Secondary | ICD-10-CM

## 2018-10-21 DIAGNOSIS — E113519 Type 2 diabetes mellitus with proliferative diabetic retinopathy with macular edema, unspecified eye: Secondary | ICD-10-CM | POA: Diagnosis present

## 2018-10-21 DIAGNOSIS — H2513 Age-related nuclear cataract, bilateral: Secondary | ICD-10-CM

## 2018-10-21 NOTE — H&P (Signed)
Yonah Shryock is an 48 y.o. male.   Chief Complaint:  painless loss of vision left eye HPI: loss of vision for 6 weeks   Past Medical History:  Diagnosis Date  . Diabetes mellitus without complication (HCC)   . Hypertension     No past surgical history on file.  No family history on file. Social History:  reports that he has never smoked. He has never used smokeless tobacco. He reports that he does not drink alcohol or use drugs.  Allergies: No Known Allergies  No medications prior to admission.    Review of systems otherwise negative  There were no vitals taken for this visit.  Physical exam: Mental status: oriented x3. Eyes: See eye exam associated with this date of surgery in media tab.  Scanned in by scanning center Ears, Nose, Throat: within normal limits Neck: Within Normal limits General: within normal limits Chest: Within normal limits Breast: deferred Heart: Within normal limits Abdomen: Within normal limits GU: deferred Extremities: within normal limits Skin: within normal limits  Assessment/Plan Vitreous hemorrhage.  Proliferative diabetic retinopathy left eye Plan: To [facility] for Pars plana vitrectomy, membrane peel, laser, gas injection.left eye  Sherrie George 10/21/2018, 3:18 PM

## 2018-11-07 ENCOUNTER — Encounter (HOSPITAL_COMMUNITY): Payer: Self-pay | Admitting: *Deleted

## 2018-11-07 ENCOUNTER — Other Ambulatory Visit: Payer: Self-pay

## 2018-11-07 NOTE — Progress Notes (Signed)
Spoke with Randy Benson for pre-op call. Randy Benson denies cardiac history. Randy Benson is a type 2 diabetic. Last A1C was 6.0 in October per Randy Benson. He states his fasting blood sugar is usually between 107-130. Randy Benson only takes Lantus 10 units if his blood sugar is over 200. He states Dr. [name]' instructed him to take 1/2 of that dose if he needs to take it tomorrow. He was instructed to stop his Aspirin by Dr. [name] and his last dose was 10/30/2018. He states Dr. [name] did not tell him to take any medications day of surgery.

## 2018-11-08 ENCOUNTER — Other Ambulatory Visit: Payer: Self-pay

## 2018-11-08 ENCOUNTER — Ambulatory Visit (HOSPITAL_COMMUNITY)
Admission: RE | Admit: 2018-11-08 | Discharge: 2018-11-09 | Disposition: A | Discharge status: Home / Self Care | Payer: PRIVATE HEALTH INSURANCE | Attending: Ophthalmology | Admitting: Ophthalmology

## 2018-11-08 ENCOUNTER — Encounter (HOSPITAL_COMMUNITY)
Admission: RE | Disposition: A | Discharge status: Home / Self Care | Payer: Self-pay | Source: Home / Self Care | Attending: Ophthalmology

## 2018-11-08 ENCOUNTER — Ambulatory Visit (HOSPITAL_COMMUNITY): Payer: PRIVATE HEALTH INSURANCE | Admitting: Certified Registered Nurse Anesthetist

## 2018-11-08 ENCOUNTER — Encounter (HOSPITAL_COMMUNITY): Payer: Self-pay

## 2018-11-08 DIAGNOSIS — E113519 Type 2 diabetes mellitus with proliferative diabetic retinopathy with macular edema, unspecified eye: Secondary | ICD-10-CM | POA: Diagnosis present

## 2018-11-08 DIAGNOSIS — E1139 Type 2 diabetes mellitus with other diabetic ophthalmic complication: Secondary | ICD-10-CM | POA: Diagnosis present

## 2018-11-08 DIAGNOSIS — E11311 Type 2 diabetes mellitus with unspecified diabetic retinopathy with macular edema: Secondary | ICD-10-CM | POA: Diagnosis not present

## 2018-11-08 DIAGNOSIS — E113592 Type 2 diabetes mellitus with proliferative diabetic retinopathy without macular edema, left eye: Secondary | ICD-10-CM | POA: Insufficient documentation

## 2018-11-08 DIAGNOSIS — Z79899 Other long term (current) drug therapy: Secondary | ICD-10-CM | POA: Diagnosis not present

## 2018-11-08 DIAGNOSIS — I10 Essential (primary) hypertension: Secondary | ICD-10-CM | POA: Diagnosis not present

## 2018-11-08 DIAGNOSIS — Z794 Long term (current) use of insulin: Secondary | ICD-10-CM | POA: Diagnosis not present

## 2018-11-08 DIAGNOSIS — H4312 Vitreous hemorrhage, left eye: Secondary | ICD-10-CM

## 2018-11-08 HISTORY — PX: GAS/FLUID EXCHANGE: SHX5334

## 2018-11-08 HISTORY — PX: PARS PLANA VITRECTOMY 27 GAUGE: SHX6738

## 2018-11-08 LAB — GLUCOSE, CAPILLARY
GLUCOSE-CAPILLARY: 129 mg/dL — AB (ref 70–99)
Glucose-Capillary: 95 mg/dL (ref 70–99)
Glucose-Capillary: 69 mg/dL — ABNORMAL LOW (ref 70–99)
Glucose-Capillary: 69 mg/dL — ABNORMAL LOW (ref 70–99)
Glucose-Capillary: 73 mg/dL (ref 70–99)
Glucose-Capillary: 77 mg/dL (ref 70–99)
Glucose-Capillary: 244 mg/dL — ABNORMAL HIGH (ref 70–99)

## 2018-11-08 LAB — BASIC METABOLIC PANEL
ANION GAP: 10 (ref 5–15)
BUN: 20 mg/dL (ref 6–20)
CALCIUM: 9.9 mg/dL (ref 8.9–10.3)
CO2: 24 mmol/L (ref 22–32)
CREATININE: 1.11 mg/dL (ref 0.61–1.24)
Chloride: 105 mmol/L (ref 98–111)
GLUCOSE: 96 mg/dL (ref 70–99)
Potassium: 3.9 mmol/L (ref 3.5–5.1)
Sodium: 139 mmol/L (ref 135–145)

## 2018-11-08 LAB — CBC
HCT: 39.9 % (ref 39.0–52.0)
Hemoglobin: 12.5 g/dL — ABNORMAL LOW (ref 13.0–17.0)
MCH: 29.8 pg (ref 26.0–34.0)
MCHC: 31.3 g/dL (ref 30.0–36.0)
MCV: 95 fL (ref 80.0–100.0)
NRBC: 0 % (ref 0.0–0.2)
PLATELETS: 236 10*3/uL (ref 150–400)
RBC: 4.2 MIL/uL — ABNORMAL LOW (ref 4.22–5.81)
RDW: 12.3 % (ref 11.5–15.5)
WBC: 6.3 10*3/uL (ref 4.0–10.5)

## 2018-11-08 SURGERY — PARS PLANA VITRECTOMY 27 GAUGE
Anesthesia: General | Site: Eye | Laterality: Left

## 2018-11-08 MED ORDER — IRBESARTAN 300 MG PO TABS
300.0000 mg | ORAL_TABLET | Freq: Every day | ORAL | Status: DC
  Administered 2018-11-08: 300 mg via ORAL
  Filled 2018-11-08: qty 1

## 2018-11-08 MED ORDER — PREDNISOLONE ACETATE 1 % OP SUSP
1.0000 [drp] | Freq: Four times a day (QID) | OPHTHALMIC | Status: DC
  Filled 2018-11-08: qty 5

## 2018-11-08 MED ORDER — HYDROCHLOROTHIAZIDE 25 MG PO TABS
25.0000 mg | ORAL_TABLET | Freq: Every day | ORAL | Status: DC
  Administered 2018-11-08: 25 mg via ORAL
  Filled 2018-11-08: qty 1

## 2018-11-08 MED ORDER — ROCURONIUM BROMIDE 100 MG/10ML IV SOLN
INTRAVENOUS | Status: DC | PRN
  Administered 2018-11-08: 70 mg via INTRAVENOUS

## 2018-11-08 MED ORDER — SODIUM CHLORIDE (PF) 0.9 % IJ SOLN
INTRAMUSCULAR | Status: AC
  Filled 2018-11-08: qty 10

## 2018-11-08 MED ORDER — BSS IO SOLN
INTRAOCULAR | Status: DC | PRN
  Administered 2018-11-08: 15 mL via INTRAOCULAR

## 2018-11-08 MED ORDER — ONDANSETRON HCL 4 MG/2ML IJ SOLN
INTRAMUSCULAR | Status: DC | PRN
  Administered 2018-11-08: 4 mg via INTRAVENOUS

## 2018-11-08 MED ORDER — ACETAMINOPHEN 500 MG PO TABS
1000.0000 mg | ORAL_TABLET | Freq: Once | ORAL | Status: AC
  Administered 2018-11-08: 1000 mg via ORAL
  Filled 2018-11-08: qty 2

## 2018-11-08 MED ORDER — DEXAMETHASONE SODIUM PHOSPHATE 10 MG/ML IJ SOLN
INTRAMUSCULAR | Status: AC
  Filled 2018-11-08: qty 1

## 2018-11-08 MED ORDER — STERILE WATER FOR INJECTION IJ SOLN
INTRAMUSCULAR | Status: DC | PRN
  Administered 2018-11-08: 20 mL

## 2018-11-08 MED ORDER — SODIUM CHLORIDE 0.9 % IV SOLN
INTRAVENOUS | Status: DC | PRN
  Administered 2018-11-08 (×2): via INTRAVENOUS

## 2018-11-08 MED ORDER — ATROPINE SULFATE 1 % OP SOLN
OPHTHALMIC | Status: AC
  Filled 2018-11-08: qty 5

## 2018-11-08 MED ORDER — LATANOPROST 0.005 % OP SOLN
1.0000 [drp] | Freq: Every day | OPHTHALMIC | Status: DC
  Filled 2018-11-08: qty 2.5

## 2018-11-08 MED ORDER — PHENYLEPHRINE HCL 10 MG/ML IJ SOLN
INTRAMUSCULAR | Status: DC | PRN
  Administered 2018-11-08 (×2): 80 ug via INTRAVENOUS

## 2018-11-08 MED ORDER — EPINEPHRINE PF 1 MG/ML IJ SOLN
INTRAMUSCULAR | Status: DC | PRN
  Administered 2018-11-08: .3 mL

## 2018-11-08 MED ORDER — GATIFLOXACIN 0.5 % OP SOLN
1.0000 [drp] | OPHTHALMIC | Status: AC | PRN
  Administered 2018-11-08 (×3): 1 [drp] via OPHTHALMIC
  Filled 2018-11-08: qty 2.5

## 2018-11-08 MED ORDER — BACITRACIN-POLYMYXIN B 500-10000 UNIT/GM OP OINT
TOPICAL_OINTMENT | OPHTHALMIC | Status: DC | PRN
  Administered 2018-11-08: 1 via OPHTHALMIC

## 2018-11-08 MED ORDER — MIDAZOLAM HCL 5 MG/5ML IJ SOLN
INTRAMUSCULAR | Status: DC | PRN
  Administered 2018-11-08: 2 mg via INTRAVENOUS

## 2018-11-08 MED ORDER — PHENYLEPHRINE HCL 2.5 % OP SOLN
1.0000 [drp] | OPHTHALMIC | Status: AC | PRN
  Administered 2018-11-08 (×3): 1 [drp] via OPHTHALMIC
  Filled 2018-11-08: qty 2

## 2018-11-08 MED ORDER — DEXAMETHASONE SODIUM PHOSPHATE 10 MG/ML IJ SOLN
INTRAMUSCULAR | Status: DC | PRN
  Administered 2018-11-08: 10 mg

## 2018-11-08 MED ORDER — TETRACAINE HCL 0.5 % OP SOLN
2.0000 [drp] | Freq: Once | OPHTHALMIC | Status: DC
  Filled 2018-11-08: qty 4

## 2018-11-08 MED ORDER — ACETAZOLAMIDE SODIUM 500 MG IJ SOLR
INTRAMUSCULAR | Status: AC
  Filled 2018-11-08: qty 500

## 2018-11-08 MED ORDER — CYCLOPENTOLATE HCL 1 % OP SOLN
1.0000 [drp] | OPHTHALMIC | Status: AC | PRN
  Administered 2018-11-08 (×3): 1 [drp] via OPHTHALMIC
  Filled 2018-11-08: qty 2

## 2018-11-08 MED ORDER — SUGAMMADEX SODIUM 200 MG/2ML IV SOLN
INTRAVENOUS | Status: DC | PRN
  Administered 2018-11-08: 200 mg via INTRAVENOUS

## 2018-11-08 MED ORDER — LIDOCAINE HCL (CARDIAC) PF 100 MG/5ML IV SOSY
PREFILLED_SYRINGE | INTRAVENOUS | Status: DC | PRN
  Administered 2018-11-08: 100 mg via INTRAVENOUS

## 2018-11-08 MED ORDER — ACETAZOLAMIDE SODIUM 500 MG IJ SOLR
500.0000 mg | Freq: Once | INTRAMUSCULAR | Status: AC
  Administered 2018-11-09: 500 mg via INTRAVENOUS
  Filled 2018-11-08: qty 500

## 2018-11-08 MED ORDER — FENTANYL CITRATE (PF) 250 MCG/5ML IJ SOLN
INTRAMUSCULAR | Status: DC | PRN
  Administered 2018-11-08 (×2): 50 ug via INTRAVENOUS
  Administered 2018-11-08: 100 ug via INTRAVENOUS

## 2018-11-08 MED ORDER — SODIUM HYALURONATE 10 MG/ML IO SOLN
INTRAOCULAR | Status: AC
  Filled 2018-11-08: qty 0.85

## 2018-11-08 MED ORDER — FENTANYL CITRATE (PF) 100 MCG/2ML IJ SOLN
25.0000 ug | INTRAMUSCULAR | Status: DC | PRN
  Administered 2018-11-08: 25 ug via INTRAVENOUS

## 2018-11-08 MED ORDER — TEMAZEPAM 15 MG PO CAPS: 15.0000 mg | ORAL_CAPSULE | Freq: Every evening | ORAL | Status: DC | PRN

## 2018-11-08 MED ORDER — MIDAZOLAM HCL 2 MG/2ML IJ SOLN
INTRAMUSCULAR | Status: AC
  Filled 2018-11-08: qty 2

## 2018-11-08 MED ORDER — BRIMONIDINE TARTRATE 0.2 % OP SOLN
1.0000 [drp] | Freq: Two times a day (BID) | OPHTHALMIC | Status: DC
  Filled 2018-11-08: qty 5

## 2018-11-08 MED ORDER — BACITRACIN-POLYMYXIN B 500-10000 UNIT/GM OP OINT
TOPICAL_OINTMENT | OPHTHALMIC | Status: AC
  Filled 2018-11-08: qty 3.5

## 2018-11-08 MED ORDER — ACETAMINOPHEN 325 MG PO TABS: 325.0000 mg | ORAL_TABLET | ORAL | Status: DC | PRN

## 2018-11-08 MED ORDER — BEVACIZUMAB CHEMO INJECTION 1.25MG/0.05ML SYRINGE FOR KALEIDOSCOPE
INTRAVITREAL | Status: DC | PRN
  Administered 2018-11-08: 1.25 mg via INTRAVITREAL

## 2018-11-08 MED ORDER — ATROPINE SULFATE 1 % OP SOLN
OPHTHALMIC | Status: DC | PRN
  Administered 2018-11-08: 1 [drp] via OPHTHALMIC

## 2018-11-08 MED ORDER — FENTANYL CITRATE (PF) 100 MCG/2ML IJ SOLN
INTRAMUSCULAR | Status: AC
  Filled 2018-11-08: qty 2

## 2018-11-08 MED ORDER — CEFTAZIDIME 1 G IJ SOLR
INTRAMUSCULAR | Status: AC
  Filled 2018-11-08: qty 1

## 2018-11-08 MED ORDER — EPINEPHRINE PF 1 MG/ML IJ SOLN
INTRAMUSCULAR | Status: AC
  Filled 2018-11-08: qty 1

## 2018-11-08 MED ORDER — HYDROCODONE-ACETAMINOPHEN 5-325 MG PO TABS: 1.0000 | ORAL_TABLET | ORAL | Status: DC | PRN

## 2018-11-08 MED ORDER — GELATIN ABSORBABLE 12-7 MM EX MISC
CUTANEOUS | Status: DC | PRN
  Administered 2018-11-08: 1

## 2018-11-08 MED ORDER — FENTANYL CITRATE (PF) 250 MCG/5ML IJ SOLN
INTRAMUSCULAR | Status: AC
  Filled 2018-11-08: qty 5

## 2018-11-08 MED ORDER — BUPIVACAINE HCL (PF) 0.75 % IJ SOLN
INTRAMUSCULAR | Status: DC | PRN
  Administered 2018-11-08: 10 mL

## 2018-11-08 MED ORDER — METFORMIN HCL 500 MG PO TABS
1000.0000 mg | ORAL_TABLET | Freq: Two times a day (BID) | ORAL | Status: DC
  Administered 2018-11-08: 1000 mg via ORAL
  Filled 2018-11-08: qty 2

## 2018-11-08 MED ORDER — BUPIVACAINE HCL (PF) 0.75 % IJ SOLN
INTRAMUSCULAR | Status: AC
  Filled 2018-11-08: qty 10

## 2018-11-08 MED ORDER — ATORVASTATIN CALCIUM 10 MG PO TABS
10.0000 mg | ORAL_TABLET | Freq: Every day | ORAL | Status: DC
  Administered 2018-11-08: 10 mg via ORAL
  Filled 2018-11-08: qty 1

## 2018-11-08 MED ORDER — HYALURONIDASE HUMAN 150 UNIT/ML IJ SOLN
INTRAMUSCULAR | Status: AC
  Filled 2018-11-08: qty 1

## 2018-11-08 MED ORDER — AMLODIPINE BESYLATE 10 MG PO TABS
10.0000 mg | ORAL_TABLET | Freq: Every day | ORAL | Status: DC
  Administered 2018-11-08: 10 mg via ORAL
  Filled 2018-11-08: qty 1

## 2018-11-08 MED ORDER — SUCCINYLCHOLINE CHLORIDE 20 MG/ML IJ SOLN
INTRAMUSCULAR | Status: DC | PRN
  Administered 2018-11-08: 80 mg via INTRAVENOUS

## 2018-11-08 MED ORDER — INSULIN ASPART 100 UNIT/ML ~~LOC~~ SOLN
0.0000 [IU] | SUBCUTANEOUS | Status: DC
  Administered 2018-11-08: 2 [IU] via SUBCUTANEOUS
  Administered 2018-11-08: 5 [IU] via SUBCUTANEOUS
  Administered 2018-11-09 (×2): 8 [IU] via SUBCUTANEOUS

## 2018-11-08 MED ORDER — SODIUM HYALURONATE 10 MG/ML IO SOLN
INTRAOCULAR | Status: DC | PRN
  Administered 2018-11-08: 0.85 mL via INTRAOCULAR

## 2018-11-08 MED ORDER — MORPHINE SULFATE (PF) 2 MG/ML IV SOLN: 1.0000 mg | INTRAVENOUS | Status: DC | PRN

## 2018-11-08 MED ORDER — 0.9 % SODIUM CHLORIDE (POUR BTL) OPTIME
TOPICAL | Status: DC | PRN
  Administered 2018-11-08: 1000 mL

## 2018-11-08 MED ORDER — ONDANSETRON HCL 4 MG/2ML IJ SOLN
4.0000 mg | Freq: Four times a day (QID) | INTRAMUSCULAR | Status: DC
  Administered 2018-11-08: 4 mg via INTRAVENOUS
  Filled 2018-11-08: qty 2

## 2018-11-08 MED ORDER — DEXAMETHASONE SODIUM PHOSPHATE 10 MG/ML IJ SOLN
INTRAMUSCULAR | Status: DC | PRN
  Administered 2018-11-08: 10 mg via INTRAVENOUS

## 2018-11-08 MED ORDER — TROPICAMIDE 1 % OP SOLN
1.0000 [drp] | OPHTHALMIC | Status: AC | PRN
  Administered 2018-11-08 (×3): 1 [drp] via OPHTHALMIC
  Filled 2018-11-08: qty 15

## 2018-11-08 MED ORDER — AMLODIPINE-VALSARTAN-HCTZ 10-320-25 MG PO TABS: 1.0000 | ORAL_TABLET | Freq: Every day | ORAL | Status: DC

## 2018-11-08 MED ORDER — DORZOLAMIDE HCL-TIMOLOL MAL 2-0.5 % OP SOLN
1.0000 [drp] | Freq: Two times a day (BID) | OPHTHALMIC | Status: DC
  Administered 2018-11-08: 1 [drp] via OPHTHALMIC
  Filled 2018-11-08: qty 10

## 2018-11-08 MED ORDER — PROPOFOL 10 MG/ML IV BOLUS
INTRAVENOUS | Status: AC
  Filled 2018-11-08: qty 20

## 2018-11-08 MED ORDER — SODIUM CHLORIDE 0.45 % IV SOLN
INTRAVENOUS | Status: DC
  Administered 2018-11-08: 17:00:00 via INTRAVENOUS

## 2018-11-08 MED ORDER — BEVACIZUMAB CHEMO INJECTION 1.25MG/0.05ML SYRINGE FOR KALEIDOSCOPE
1.2500 mg | Freq: Once | INTRAVITREAL | Status: DC
  Filled 2018-11-08: qty 0.1

## 2018-11-08 MED ORDER — LIDOCAINE HCL 2 % IJ SOLN
INTRAMUSCULAR | Status: AC
  Filled 2018-11-08: qty 20

## 2018-11-08 MED ORDER — INSULIN GLARGINE 100 UNIT/ML ~~LOC~~ SOLN
10.0000 [IU] | Freq: Every day | SUBCUTANEOUS | Status: DC | PRN
  Filled 2018-11-08: qty 0.1

## 2018-11-08 MED ORDER — DORZOLAMIDE HCL 2 % OP SOLN
1.0000 [drp] | Freq: Three times a day (TID) | OPHTHALMIC | Status: DC
  Filled 2018-11-08: qty 10

## 2018-11-08 MED ORDER — BSS PLUS IO SOLN
INTRAOCULAR | Status: AC
  Filled 2018-11-08: qty 500

## 2018-11-08 MED ORDER — CEFAZOLIN SODIUM-DEXTROSE 2-4 GM/100ML-% IV SOLN
2.0000 g | INTRAVENOUS | Status: AC
  Administered 2018-11-08: 2 g via INTRAVENOUS
  Filled 2018-11-08: qty 100

## 2018-11-08 MED ORDER — POLYMYXIN B SULFATE 500000 UNITS IJ SOLR
INTRAMUSCULAR | Status: AC
  Filled 2018-11-08: qty 500000

## 2018-11-08 MED ORDER — MAGNESIUM HYDROXIDE 400 MG/5ML PO SUSP: 15.0000 mL | Freq: Four times a day (QID) | ORAL | Status: DC | PRN

## 2018-11-08 MED ORDER — STERILE WATER FOR INJECTION IJ SOLN
INTRAMUSCULAR | Status: AC
  Filled 2018-11-08: qty 20

## 2018-11-08 MED ORDER — BACITRACIN-POLYMYXIN B 500-10000 UNIT/GM OP OINT
1.0000 "application " | TOPICAL_OINTMENT | Freq: Three times a day (TID) | OPHTHALMIC | Status: DC
  Filled 2018-11-08: qty 3.5

## 2018-11-08 MED ORDER — EPHEDRINE SULFATE 50 MG/ML IJ SOLN
INTRAMUSCULAR | Status: DC | PRN
  Administered 2018-11-08: 5 mg via INTRAVENOUS

## 2018-11-08 MED ORDER — GATIFLOXACIN 0.5 % OP SOLN
1.0000 [drp] | Freq: Four times a day (QID) | OPHTHALMIC | Status: DC
  Filled 2018-11-08: qty 2.5

## 2018-11-08 MED ORDER — PROPOFOL 10 MG/ML IV BOLUS
INTRAVENOUS | Status: DC | PRN
  Administered 2018-11-08: 120 mg via INTRAVENOUS

## 2018-11-08 MED ORDER — BSS PLUS IO SOLN
INTRAOCULAR | Status: DC | PRN
  Administered 2018-11-08: 1 via INTRAOCULAR

## 2018-11-08 SURGICAL SUPPLY — 81 items
APPLICATOR DR MATTHEWS STRL (MISCELLANEOUS) IMPLANT
BLADE EYE CATARACT 19 1.4 BEAV (BLADE) IMPLANT
BLADE MVR KNIFE 19G (BLADE) IMPLANT
BLADE MVR KNIFE 20G (BLADE) IMPLANT
BNDG EYE OVAL (GAUZE/BANDAGES/DRESSINGS) ×1 IMPLANT
CABLE BIPOLOR RESECTION CORD (MISCELLANEOUS) IMPLANT
CANNULA ANTERIOR CHAMBER 27GA (MISCELLANEOUS) IMPLANT
CANNULA DUAL BORE 23G (CANNULA) IMPLANT
CANNULA TROCAR 25G 6 VLV (OPHTHALMIC) IMPLANT
CANNULA TROCAR 25GA VLV (OPHTHALMIC) IMPLANT
CANNULA VLV SOFT TIP 27G (OPHTHALMIC) ×1 IMPLANT
CANNULA VLV SOFT TIP 27GA (OPHTHALMIC) ×2 IMPLANT
COTTONBALL LRG STERILE PKG (GAUZE/BANDAGES/DRESSINGS) ×6 IMPLANT
COVER MAYO STAND STRL (DRAPES) IMPLANT
COVER WAND RF STERILE (DRAPES) ×2 IMPLANT
DRAPE INCISE 51X51 W/FILM STRL (DRAPES) IMPLANT
DRAPE OPHTHALMIC 77X100 STRL (CUSTOM PROCEDURE TRAY) ×2 IMPLANT
FILTER BLUE MILLIPORE (MISCELLANEOUS) IMPLANT
FILTER STRAW FLUID ASPIR (MISCELLANEOUS) IMPLANT
FORCEPS ECKARDT ILM 25G SERR (OPHTHALMIC RELATED) IMPLANT
FORCEPS GRIESHABER ILM 27G (INSTRUMENTS) ×2 IMPLANT
GLOVE SS BIOGEL STRL SZ 6.5 (GLOVE) ×1 IMPLANT
GLOVE SS BIOGEL STRL SZ 7 (GLOVE) ×1 IMPLANT
GLOVE SUPERSENSE BIOGEL SZ 6.5 (GLOVE) ×1
GLOVE SUPERSENSE BIOGEL SZ 7 (GLOVE) ×1
GLOVE SURG 8.5 LATEX PF (GLOVE) ×2 IMPLANT
GOWN STRL REUS W/ TWL LRG LVL3 (GOWN DISPOSABLE) ×3 IMPLANT
GOWN STRL REUS W/TWL LRG LVL3 (GOWN DISPOSABLE) ×3
HANDLE PNEUMATIC FOR CONSTEL (OPHTHALMIC) IMPLANT
KIT BASIN OR (CUSTOM PROCEDURE TRAY) ×2 IMPLANT
KNIFE CRESCENT 2.5 55 ANG (BLADE) IMPLANT
MICROPICK 25G (MISCELLANEOUS)
NDL 18GX1X1/2 (RX/OR ONLY) (NEEDLE) ×1 IMPLANT
NDL 25GX 5/8IN NON SAFETY (NEEDLE) IMPLANT
NDL FILTER BLUNT 18X1 1/2 (NEEDLE) ×1 IMPLANT
NDL HYPO 30X.5 LL (NEEDLE) IMPLANT
NDL PRECISIONGLIDE 27X1.5 (NEEDLE) ×1 IMPLANT
NEEDLE 18GX1X1/2 (RX/OR ONLY) (NEEDLE) ×2 IMPLANT
NEEDLE 25GX 5/8IN NON SAFETY (NEEDLE) IMPLANT
NEEDLE FILTER BLUNT 18X 1/2SAF (NEEDLE) ×1
NEEDLE FILTER BLUNT 18X1 1/2 (NEEDLE) ×1 IMPLANT
NEEDLE HYPO 30X.5 LL (NEEDLE) IMPLANT
NEEDLE PRECISIONGLIDE 27X1.5 (NEEDLE) ×2 IMPLANT
NS IRRIG 1000ML POUR BTL (IV SOLUTION) ×2 IMPLANT
PACK VITRECTOMY CUSTOM (CUSTOM PROCEDURE TRAY) ×2 IMPLANT
PAD ARMBOARD 7.5X6 YLW CONV (MISCELLANEOUS) ×4 IMPLANT
PAK VITRECTOMY PIK  27GA (OPHTHALMIC) ×1
PAK VITRECTOMY PIK 27GA (OPHTHALMIC) ×1 IMPLANT
PENCIL BIPOLAR 25GA STR DISP (OPHTHALMIC RELATED) IMPLANT
PIC ILLUMINATED 25G (OPHTHALMIC) ×2
PICK MICROPICK 25G (MISCELLANEOUS) IMPLANT
PIK ILLUMINATED 25G (OPHTHALMIC) ×1 IMPLANT
PROBE DIATHERMY DSP 27GA (MISCELLANEOUS) ×2 IMPLANT
PROBE LASER ILLUM FLEX 27GA (OPHTHALMIC) ×3 IMPLANT
PROBE LASER ILLUM FLEX CVD 25G (OPHTHALMIC) IMPLANT
REPL STRA BRUSH NDL (NEEDLE) IMPLANT
REPL STRA BRUSH NEEDLE (NEEDLE) IMPLANT
RESERVOIR BACK FLUSH (MISCELLANEOUS) IMPLANT
ROLLS DENTAL (MISCELLANEOUS) ×4 IMPLANT
SCISSORS TIP ADVANCED DSP 25GA (INSTRUMENTS) IMPLANT
SCRAPER DIAMOND 25GA (OPHTHALMIC RELATED) IMPLANT
SCRAPER DIAMOND DUST MEMBRANE (MISCELLANEOUS) IMPLANT
SHIELD EYE LENSE ONLY DISP (GAUZE/BANDAGES/DRESSINGS) ×1 IMPLANT
SPONGE SURGIFOAM ABS GEL 12-7 (HEMOSTASIS) ×2 IMPLANT
STOPCOCK 4 WAY LG BORE MALE ST (IV SETS) IMPLANT
SUT CHROMIC 7 0 TG140 8 (SUTURE) IMPLANT
SUT ETHILON 10 0 CS140 6 (SUTURE) IMPLANT
SUT ETHILON 9 0 TG140 8 (SUTURE) IMPLANT
SUT POLY NON ABSORB 10-0 8 STR (SUTURE) IMPLANT
SUT SILK 4 0 RB 1 (SUTURE) IMPLANT
SYR 10ML LL (SYRINGE) IMPLANT
SYR 20CC LL (SYRINGE) ×2 IMPLANT
SYR 5ML LL (SYRINGE) IMPLANT
SYR BULB 3OZ (MISCELLANEOUS) ×2 IMPLANT
SYR TB 1ML LUER SLIP (SYRINGE) ×2 IMPLANT
TAPE SURG TRANSPORE 1 IN (GAUZE/BANDAGES/DRESSINGS) IMPLANT
TAPE SURGICAL TRANSPORE 1 IN (GAUZE/BANDAGES/DRESSINGS) ×1
TOWEL NATURAL 6PK STERILE (DISPOSABLE) ×2 IMPLANT
TUBING HIGH PRESS EXTEN 6IN (TUBING) IMPLANT
WATER STERILE IRR 1000ML POUR (IV SOLUTION) ×2 IMPLANT
WIPE INSTRUMENT VISIWIPE 73X73 (MISCELLANEOUS) IMPLANT

## 2018-11-08 NOTE — Brief Op Note (Signed)
11/08/2018  2:39 PM  PATIENT:  Randy Benson  48 y.o. male  PRE-OPERATIVE DIAGNOSIS:  vitreous hemorrhage left eye  POST-OPERATIVE DIAGNOSIS:  vitreous hemorrhage left eye  PROCEDURE:  Procedure(s): PARS PLANA VITRECTOMY 27 GAUGE (Left) Gas/Fluid Exchange (Left)  SURGEON:  Surgeon(s) and Role:    Sherrie George, MD - Primary  Brief Operative note   Preoperative diagnosis:  vitreous hemorrhage left eye Postoperative diagnosis  Vitreous hemorrhage left eye.  Posterior hyaloid membranes left eye  Procedures: Pars plana vitrectomy, membrane peel, laser, gas injection left eye.   Surgeon:  Sherrie George, MD...  Assistant:  Rosalie Doctor SA    Anesthesia: General  Specimen: none  Estimated blood loss:  1cc  Complications: none  Patient sent to PACU in good condition  Composed by Sherrie George MD  Dictation number: [phone]

## 2018-11-08 NOTE — Anesthesia Postprocedure Evaluation (Signed)
Anesthesia Post Note  Patient: Randy Benson  Procedure(s) Performed: PARS PLANA VITRECTOMY 27 GAUGE (Left Eye) Gas/Fluid Exchange (Left Eye)     Patient location during evaluation: PACU Anesthesia Type: General Level of consciousness: awake and alert Pain management: pain level controlled Vital Signs Assessment: post-procedure vital signs reviewed and stable Respiratory status: spontaneous breathing, nonlabored ventilation, respiratory function stable and patient connected to nasal cannula oxygen Cardiovascular status: blood pressure returned to baseline and stable Postop Assessment: no apparent nausea or vomiting Anesthetic complications: no    Last Vitals:  Vitals:   11/08/18 0952 11/08/18 1510  BP: (!) 156/87 111/69  Pulse: 73 75  Resp: 18 14  Temp: 36.5 C (!) 36.3 C  SpO2: 100% 98%    Last Pain:  Vitals:   11/08/18 1510  TempSrc:   PainSc: Asleep                 Damonte Frieson S

## 2018-11-08 NOTE — Anesthesia Preprocedure Evaluation (Addendum)
Anesthesia Evaluation  Patient identified by MRN, date of birth, ID band Patient awake    Reviewed: Allergy & Precautions, NPO status , Patient's Chart, lab work & pertinent test results  Airway Mallampati: II  TM Distance: >3 FB Neck ROM: Full    Dental no notable dental hx. (+) Teeth Intact, Dental Advisory Given   Pulmonary neg pulmonary ROS,    Pulmonary exam normal breath sounds clear to auscultation       Cardiovascular hypertension, Pt. on medications negative cardio ROS Normal cardiovascular exam Rhythm:Regular Rate:Normal  TTE 2017 EF 55-60%, no valve abnormalities   Neuro/Psych negative neurological ROS  negative psych ROS   GI/Hepatic negative GI ROS, Neg liver ROS,   Endo/Other  negative endocrine ROSdiabetes, Type 2, Oral Hypoglycemic Agents, Insulin Dependent  Renal/GU negative Renal ROS  negative genitourinary   Musculoskeletal   Abdominal   Peds  Hematology negative hematology ROS (+)   Anesthesia Other Findings Vitreous hemorrhage of left eye   Reproductive/Obstetrics negative OB ROS                            Anesthesia Physical Anesthesia Plan  ASA: II  Anesthesia Plan: General   Post-op Pain Management:    Induction: Intravenous  PONV Risk Score and Plan: 2 and Ondansetron, Dexamethasone and Midazolam  Airway Management Planned: Oral ETT  Additional Equipment:   Intra-op Plan:   Post-operative Plan: Extubation in OR  Informed Consent: I have reviewed the patients History and Physical, chart, labs and discussed the procedure including the risks, benefits and alternatives for the proposed anesthesia with the patient or authorized representative who has indicated his/her understanding and acceptance.     Dental advisory given  Plan Discussed with: CRNA  Anesthesia Plan Comments:         Anesthesia Quick Evaluation

## 2018-11-08 NOTE — Anesthesia Procedure Notes (Signed)
Procedure Name: Intubation Date/Time: 11/08/2018 1:41 PM Performed by: Adonis Housekeeper, CRNA Pre-anesthesia Checklist: Patient identified, Emergency Drugs available, Suction available and Patient being monitored Patient Re-evaluated:Patient Re-evaluated prior to induction Oxygen Delivery Method: Circle system utilized Preoxygenation: Pre-oxygenation with 100% oxygen Induction Type: IV induction Ventilation: Mask ventilation without difficulty Laryngoscope Size: Miller and 3 Grade View: Grade I Tube type: Oral Tube size: 7.5 mm Number of attempts: 1 Airway Equipment and Method: Stylet Placement Confirmation: ETT inserted through vocal cords under direct vision,  positive ETCO2 and breath sounds checked- equal and bilateral Secured at: 23 cm Tube secured with: Tape Dental Injury: Teeth and Oropharynx as per pre-operative assessment

## 2018-11-08 NOTE — Progress Notes (Addendum)
Patient's blood glucose 69.  Called and informed Dr. Armond Hang.  No orders given as patient is asymptomatic and reports he feels fine.  Will continue to monitor closely.  Rechecked blood glucose at 1245, it remains at 69.  Patient reports that he still feels fine. Will continue to monitor

## 2018-11-08 NOTE — H&P (Signed)
I examined the patient today and there is no change in the medical status 

## 2018-11-08 NOTE — Transfer of Care (Signed)
Immediate Anesthesia Transfer of Care Note  Patient: Randy Benson  Procedure(s) Performed: PARS PLANA VITRECTOMY 27 GAUGE (Left Eye) Gas/Fluid Exchange (Left Eye)  Patient Location: PACU  Anesthesia Type:General  Level of Consciousness: awake, alert , oriented and patient cooperative  Airway & Oxygen Therapy: Patient Spontanous Breathing and Patient connected to face mask oxygen  Post-op Assessment: Report given to RN and Post -op Vital signs reviewed and stable  Post vital signs: Reviewed and stable  Last Vitals:  Vitals Value Taken Time  BP 111/69 11/08/2018  3:10 PM  Temp 36.3 C 11/08/2018  3:10 PM  Pulse 77 11/08/2018  3:16 PM  Resp 14 11/08/2018  3:16 PM  SpO2 100 % 11/08/2018  3:16 PM  Vitals shown include unvalidated device data.  Last Pain:  Vitals:   11/08/18 1510  TempSrc:   PainSc: Asleep      Patients Stated Pain Goal: 0 (11/08/18 8250)  Complications: No apparent anesthesia complications

## 2018-11-08 NOTE — Op Note (Signed)
NAME: Randy Benson, WERNICKE MEDICAL RECORD [id number] ACCOUNT [id number] DATE OF BIRTH:[birth year] FACILITY: [facility] LOCATION: [facility] PHYSICIAN:Fields Oros D. Oneka Parada, MD  OPERATIVE REPORT  DATE OF PROCEDURE:  11/08/2018  ADMISSION DIAGNOSIS:  Vitreous hemorrhage posterior membranes, left eye.  PROCEDURES:  Pars plana vitrectomy with 27-gauge membrane retinal photocoagulation, gas fluid exchange in the left eye.  SURGEON:  Alan Mulder, MD  ASSISTANT:  Rosalie Doctor, SA.  ANESTHESIA:  General.  DESCRIPTION OF PROCEDURE:  Usual prep and drape.  A 27-gauge trocar was placed at 10, 2, and 4 o'clock, infusion at 4 o'clock.  Provisc was placed on the corneal surface.  The wide field BIOM viewing system was moved into place.  Pars plana vitrectomy  was begun just behind the crystalline lens.  Dense dark red blood was encountered and this was carefully removed under low suction and rapid cutting down to the macular surface.  This was performed in a core vitrectomy fashion.  It was apparent that  there were membranes on the retinal surface.  The 27-gauge tip was used to engage with suction onto the posterior hyaloid and lift it from its attachments to the disk and the macula.  These subhyaloid elevations were taken out to the mid periphery and  far periphery.  Surface proliferation was removed in the mid periphery with the vitreous suction and vitreous cutting.  The vitrectomy was carried down to the 6 o'clock area where a large clot of blood was seen.  Scleral depression was used to gain  access to this extreme peripheral area and the vitreous was trimmed down to the retinal surface.  Extremely high-speed cutting was used during this maneuver and low suction.  The endolaser was positioned in the eye and 1288 burns were placed around the  retinal periphery and some of these burns were used to cauterize several areas of surface hemorrhage.  The power was 280 milliwatts, 1000 microns each and 0.1 seconds  each.  The cutter was repositioned in the eye and blood was vacuumed from around the  areas where hemostasis was performed.  The midvitreous was cleared and continuous vitrectomy was carried out until the entire vitreous cavity was clean and clear.  A 40% gas fluid exchange was carried out with the automated air fluid exchange system.   Once this was accomplished, the instruments were removed from the eye and the 27-gauge trocars were removed from the eye.  Avastin 1.25 mg was injected into the vitreous cavity from the 12 o'clock pars plana.  All wounds were tested and found to be  secure.  Polymyxin and ceftazidime were rinsed around the globe for antibiotic coverage.  Decadron 10 mg was injected into the lower subconjunctival space.  Atropine solution was applied.  Marcaine was injected around the globe for postoperative pain.   Closing pressure was 10 with a [place]raquer tonometer.  Polysporin ophthalmic ointment, a patch and a shield were placed.  The patient was awakened and taken to recovery in satisfactory condition.  COMPLICATIONS:  None.  DURATION:  One hour.  TN/NUANCE  D:11/08/2018 T:11/08/2018 JOB:[id number]

## 2018-11-09 ENCOUNTER — Encounter (HOSPITAL_COMMUNITY): Payer: Self-pay | Admitting: Ophthalmology

## 2018-11-09 DIAGNOSIS — E113592 Type 2 diabetes mellitus with proliferative diabetic retinopathy without macular edema, left eye: Secondary | ICD-10-CM | POA: Diagnosis not present

## 2018-11-09 LAB — GLUCOSE, CAPILLARY
GLUCOSE-CAPILLARY: 258 mg/dL — AB (ref 70–99)
Glucose-Capillary: 269 mg/dL — ABNORMAL HIGH (ref 70–99)

## 2018-11-09 MED ORDER — BACITRACIN-POLYMYXIN B 500-10000 UNIT/GM OP OINT: 1.0000 "application " | TOPICAL_OINTMENT | Freq: Three times a day (TID) | OPHTHALMIC | 0 refills | Status: AC

## 2018-11-09 MED ORDER — GATIFLOXACIN 0.5 % OP SOLN: 1.0000 [drp] | Freq: Four times a day (QID) | OPHTHALMIC | Status: AC

## 2018-11-09 MED ORDER — PREDNISOLONE ACETATE 1 % OP SUSP: 1.0000 [drp] | Freq: Four times a day (QID) | OPHTHALMIC | 0 refills | Status: AC

## 2018-11-09 NOTE — Progress Notes (Signed)
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  Discharge to home.  Give post operative instructions  Sherrie George 11/09/2018, 6:29 AM

## 2018-11-09 NOTE — Discharge Summary (Signed)
Discharge summary not needed on [facility] patients per medical records. 

## 2018-11-09 NOTE — Progress Notes (Signed)
Patient discharged to home accompanied by wife.  Discharged instructions explained to patient and wife and both verbalized understanding.  Discharged packets including medications were given to the patient.

## 2018-11-14 ENCOUNTER — Encounter (INDEPENDENT_AMBULATORY_CARE_PROVIDER_SITE_OTHER): Payer: PRIVATE HEALTH INSURANCE | Admitting: Ophthalmology

## 2018-11-14 DIAGNOSIS — H4312 Vitreous hemorrhage, left eye: Secondary | ICD-10-CM

## 2018-12-05 ENCOUNTER — Other Ambulatory Visit: Payer: Self-pay

## 2018-12-05 ENCOUNTER — Encounter (INDEPENDENT_AMBULATORY_CARE_PROVIDER_SITE_OTHER): Payer: PRIVATE HEALTH INSURANCE | Admitting: Ophthalmology

## 2018-12-05 DIAGNOSIS — E11311 Type 2 diabetes mellitus with unspecified diabetic retinopathy with macular edema: Secondary | ICD-10-CM

## 2018-12-05 DIAGNOSIS — H4313 Vitreous hemorrhage, bilateral: Secondary | ICD-10-CM

## 2018-12-05 DIAGNOSIS — E113512 Type 2 diabetes mellitus with proliferative diabetic retinopathy with macular edema, left eye: Secondary | ICD-10-CM | POA: Diagnosis not present

## 2019-01-16 ENCOUNTER — Encounter (INDEPENDENT_AMBULATORY_CARE_PROVIDER_SITE_OTHER): Payer: PRIVATE HEALTH INSURANCE | Admitting: Ophthalmology

## 2019-01-16 ENCOUNTER — Other Ambulatory Visit: Payer: Self-pay

## 2019-01-16 DIAGNOSIS — E103591 Type 1 diabetes mellitus with proliferative diabetic retinopathy without macular edema, right eye: Secondary | ICD-10-CM | POA: Diagnosis not present

## 2019-01-16 DIAGNOSIS — E103512 Type 1 diabetes mellitus with proliferative diabetic retinopathy with macular edema, left eye: Secondary | ICD-10-CM | POA: Diagnosis not present

## 2019-01-16 DIAGNOSIS — E10311 Type 1 diabetes mellitus with unspecified diabetic retinopathy with macular edema: Secondary | ICD-10-CM

## 2019-01-16 DIAGNOSIS — H35033 Hypertensive retinopathy, bilateral: Secondary | ICD-10-CM

## 2019-01-16 DIAGNOSIS — H4313 Vitreous hemorrhage, bilateral: Secondary | ICD-10-CM

## 2019-01-16 DIAGNOSIS — H2513 Age-related nuclear cataract, bilateral: Secondary | ICD-10-CM

## 2019-01-16 DIAGNOSIS — H43811 Vitreous degeneration, right eye: Secondary | ICD-10-CM

## 2019-01-16 DIAGNOSIS — I10 Essential (primary) hypertension: Secondary | ICD-10-CM

## 2019-03-20 ENCOUNTER — Other Ambulatory Visit: Payer: Self-pay

## 2019-03-20 ENCOUNTER — Encounter (INDEPENDENT_AMBULATORY_CARE_PROVIDER_SITE_OTHER): Payer: PRIVATE HEALTH INSURANCE | Admitting: Ophthalmology

## 2019-03-20 DIAGNOSIS — H35033 Hypertensive retinopathy, bilateral: Secondary | ICD-10-CM

## 2019-03-20 DIAGNOSIS — I10 Essential (primary) hypertension: Secondary | ICD-10-CM | POA: Diagnosis not present

## 2019-03-20 DIAGNOSIS — E113512 Type 2 diabetes mellitus with proliferative diabetic retinopathy with macular edema, left eye: Secondary | ICD-10-CM | POA: Diagnosis not present

## 2019-03-20 DIAGNOSIS — E11311 Type 2 diabetes mellitus with unspecified diabetic retinopathy with macular edema: Secondary | ICD-10-CM

## 2019-03-20 DIAGNOSIS — H43813 Vitreous degeneration, bilateral: Secondary | ICD-10-CM

## 2019-03-20 DIAGNOSIS — E113591 Type 2 diabetes mellitus with proliferative diabetic retinopathy without macular edema, right eye: Secondary | ICD-10-CM | POA: Diagnosis not present

## 2019-05-22 ENCOUNTER — Encounter (INDEPENDENT_AMBULATORY_CARE_PROVIDER_SITE_OTHER): Payer: PRIVATE HEALTH INSURANCE | Admitting: Ophthalmology

## 2019-05-30 ENCOUNTER — Encounter (INDEPENDENT_AMBULATORY_CARE_PROVIDER_SITE_OTHER): Payer: PRIVATE HEALTH INSURANCE | Admitting: Ophthalmology

## 2019-05-30 ENCOUNTER — Other Ambulatory Visit: Payer: Self-pay

## 2019-05-30 DIAGNOSIS — E113591 Type 2 diabetes mellitus with proliferative diabetic retinopathy without macular edema, right eye: Secondary | ICD-10-CM

## 2019-05-30 DIAGNOSIS — H2513 Age-related nuclear cataract, bilateral: Secondary | ICD-10-CM

## 2019-05-30 DIAGNOSIS — E11311 Type 2 diabetes mellitus with unspecified diabetic retinopathy with macular edema: Secondary | ICD-10-CM

## 2019-05-30 DIAGNOSIS — E113512 Type 2 diabetes mellitus with proliferative diabetic retinopathy with macular edema, left eye: Secondary | ICD-10-CM

## 2019-05-30 DIAGNOSIS — I10 Essential (primary) hypertension: Secondary | ICD-10-CM

## 2019-05-30 DIAGNOSIS — H43811 Vitreous degeneration, right eye: Secondary | ICD-10-CM

## 2019-05-30 DIAGNOSIS — H35033 Hypertensive retinopathy, bilateral: Secondary | ICD-10-CM

## 2019-06-01 ENCOUNTER — Encounter (INDEPENDENT_AMBULATORY_CARE_PROVIDER_SITE_OTHER): Payer: PRIVATE HEALTH INSURANCE | Admitting: Ophthalmology

## 2019-07-28 ENCOUNTER — Encounter (INDEPENDENT_AMBULATORY_CARE_PROVIDER_SITE_OTHER): Payer: PRIVATE HEALTH INSURANCE | Admitting: Ophthalmology

## 2019-07-28 DIAGNOSIS — H43813 Vitreous degeneration, bilateral: Secondary | ICD-10-CM

## 2019-07-28 DIAGNOSIS — I10 Essential (primary) hypertension: Secondary | ICD-10-CM

## 2019-07-28 DIAGNOSIS — E11311 Type 2 diabetes mellitus with unspecified diabetic retinopathy with macular edema: Secondary | ICD-10-CM

## 2019-07-28 DIAGNOSIS — E113513 Type 2 diabetes mellitus with proliferative diabetic retinopathy with macular edema, bilateral: Secondary | ICD-10-CM | POA: Diagnosis not present

## 2019-07-28 DIAGNOSIS — H35033 Hypertensive retinopathy, bilateral: Secondary | ICD-10-CM | POA: Diagnosis not present

## 2019-07-28 DIAGNOSIS — H2513 Age-related nuclear cataract, bilateral: Secondary | ICD-10-CM

## 2019-08-02 ENCOUNTER — Encounter (INDEPENDENT_AMBULATORY_CARE_PROVIDER_SITE_OTHER): Payer: PRIVATE HEALTH INSURANCE | Admitting: Ophthalmology

## 2019-10-13 ENCOUNTER — Encounter (INDEPENDENT_AMBULATORY_CARE_PROVIDER_SITE_OTHER): Payer: PRIVATE HEALTH INSURANCE | Admitting: Ophthalmology

## 2019-10-13 DIAGNOSIS — E11319 Type 2 diabetes mellitus with unspecified diabetic retinopathy without macular edema: Secondary | ICD-10-CM

## 2019-10-13 DIAGNOSIS — H43813 Vitreous degeneration, bilateral: Secondary | ICD-10-CM

## 2019-10-13 DIAGNOSIS — E113593 Type 2 diabetes mellitus with proliferative diabetic retinopathy without macular edema, bilateral: Secondary | ICD-10-CM

## 2019-10-13 DIAGNOSIS — H35033 Hypertensive retinopathy, bilateral: Secondary | ICD-10-CM | POA: Diagnosis not present

## 2019-10-13 DIAGNOSIS — I10 Essential (primary) hypertension: Secondary | ICD-10-CM

## 2019-10-13 DIAGNOSIS — H2513 Age-related nuclear cataract, bilateral: Secondary | ICD-10-CM

## 2019-12-15 ENCOUNTER — Encounter (INDEPENDENT_AMBULATORY_CARE_PROVIDER_SITE_OTHER): Payer: PRIVATE HEALTH INSURANCE | Admitting: Ophthalmology

## 2019-12-15 DIAGNOSIS — H35033 Hypertensive retinopathy, bilateral: Secondary | ICD-10-CM

## 2019-12-15 DIAGNOSIS — E11319 Type 2 diabetes mellitus with unspecified diabetic retinopathy without macular edema: Secondary | ICD-10-CM

## 2019-12-15 DIAGNOSIS — E113593 Type 2 diabetes mellitus with proliferative diabetic retinopathy without macular edema, bilateral: Secondary | ICD-10-CM | POA: Diagnosis not present

## 2019-12-15 DIAGNOSIS — H2513 Age-related nuclear cataract, bilateral: Secondary | ICD-10-CM

## 2019-12-15 DIAGNOSIS — I10 Essential (primary) hypertension: Secondary | ICD-10-CM | POA: Diagnosis not present

## 2019-12-15 DIAGNOSIS — H43813 Vitreous degeneration, bilateral: Secondary | ICD-10-CM

## 2020-02-12 ENCOUNTER — Encounter (INDEPENDENT_AMBULATORY_CARE_PROVIDER_SITE_OTHER): Payer: PRIVATE HEALTH INSURANCE | Admitting: Ophthalmology

## 2020-02-23 ENCOUNTER — Encounter (INDEPENDENT_AMBULATORY_CARE_PROVIDER_SITE_OTHER): Payer: PRIVATE HEALTH INSURANCE | Admitting: Ophthalmology

## 2020-03-07 ENCOUNTER — Other Ambulatory Visit: Payer: Self-pay

## 2020-03-07 ENCOUNTER — Encounter (INDEPENDENT_AMBULATORY_CARE_PROVIDER_SITE_OTHER): Payer: PRIVATE HEALTH INSURANCE | Admitting: Ophthalmology

## 2020-03-07 DIAGNOSIS — H35033 Hypertensive retinopathy, bilateral: Secondary | ICD-10-CM

## 2020-03-07 DIAGNOSIS — E11319 Type 2 diabetes mellitus with unspecified diabetic retinopathy without macular edema: Secondary | ICD-10-CM | POA: Diagnosis not present

## 2020-03-07 DIAGNOSIS — E113593 Type 2 diabetes mellitus with proliferative diabetic retinopathy without macular edema, bilateral: Secondary | ICD-10-CM

## 2020-03-07 DIAGNOSIS — I10 Essential (primary) hypertension: Secondary | ICD-10-CM

## 2020-03-07 DIAGNOSIS — H43813 Vitreous degeneration, bilateral: Secondary | ICD-10-CM

## 2020-03-07 DIAGNOSIS — H2513 Age-related nuclear cataract, bilateral: Secondary | ICD-10-CM

## 2020-05-30 ENCOUNTER — Encounter (INDEPENDENT_AMBULATORY_CARE_PROVIDER_SITE_OTHER): Payer: PRIVATE HEALTH INSURANCE | Admitting: Ophthalmology

## 2020-06-07 ENCOUNTER — Encounter (INDEPENDENT_AMBULATORY_CARE_PROVIDER_SITE_OTHER): Payer: PRIVATE HEALTH INSURANCE | Admitting: Ophthalmology

## 2020-06-07 ENCOUNTER — Other Ambulatory Visit: Payer: Self-pay

## 2020-06-07 DIAGNOSIS — E11319 Type 2 diabetes mellitus with unspecified diabetic retinopathy without macular edema: Secondary | ICD-10-CM

## 2020-06-07 DIAGNOSIS — H35033 Hypertensive retinopathy, bilateral: Secondary | ICD-10-CM

## 2020-06-07 DIAGNOSIS — H43811 Vitreous degeneration, right eye: Secondary | ICD-10-CM

## 2020-06-07 DIAGNOSIS — I10 Essential (primary) hypertension: Secondary | ICD-10-CM | POA: Diagnosis not present

## 2020-06-07 DIAGNOSIS — E113593 Type 2 diabetes mellitus with proliferative diabetic retinopathy without macular edema, bilateral: Secondary | ICD-10-CM

## 2020-10-11 ENCOUNTER — Encounter (INDEPENDENT_AMBULATORY_CARE_PROVIDER_SITE_OTHER): Payer: PRIVATE HEALTH INSURANCE | Admitting: Ophthalmology

## 2020-10-25 ENCOUNTER — Encounter (INDEPENDENT_AMBULATORY_CARE_PROVIDER_SITE_OTHER): Payer: 59 | Admitting: Ophthalmology

## 2020-10-25 ENCOUNTER — Other Ambulatory Visit: Payer: Self-pay

## 2020-10-25 DIAGNOSIS — E113593 Type 2 diabetes mellitus with proliferative diabetic retinopathy without macular edema, bilateral: Secondary | ICD-10-CM | POA: Diagnosis not present

## 2020-10-25 DIAGNOSIS — I10 Essential (primary) hypertension: Secondary | ICD-10-CM | POA: Diagnosis not present

## 2020-10-25 DIAGNOSIS — H43811 Vitreous degeneration, right eye: Secondary | ICD-10-CM | POA: Diagnosis not present

## 2020-10-25 DIAGNOSIS — H35033 Hypertensive retinopathy, bilateral: Secondary | ICD-10-CM | POA: Diagnosis not present

## 2021-04-04 ENCOUNTER — Encounter (INDEPENDENT_AMBULATORY_CARE_PROVIDER_SITE_OTHER): Payer: 59 | Admitting: Ophthalmology

## 2021-05-16 ENCOUNTER — Other Ambulatory Visit: Payer: Self-pay

## 2021-05-16 ENCOUNTER — Encounter (INDEPENDENT_AMBULATORY_CARE_PROVIDER_SITE_OTHER): Payer: Self-pay | Admitting: Ophthalmology

## 2021-05-16 ENCOUNTER — Ambulatory Visit (INDEPENDENT_AMBULATORY_CARE_PROVIDER_SITE_OTHER): Payer: 59 | Admitting: Ophthalmology

## 2021-05-16 VITALS — BP 150/79 | HR 70

## 2021-05-16 DIAGNOSIS — I10 Essential (primary) hypertension: Secondary | ICD-10-CM

## 2021-05-16 DIAGNOSIS — H3581 Retinal edema: Secondary | ICD-10-CM

## 2021-05-16 DIAGNOSIS — H4311 Vitreous hemorrhage, right eye: Secondary | ICD-10-CM | POA: Diagnosis not present

## 2021-05-16 DIAGNOSIS — H25813 Combined forms of age-related cataract, bilateral: Secondary | ICD-10-CM

## 2021-05-16 DIAGNOSIS — E113553 Type 2 diabetes mellitus with stable proliferative diabetic retinopathy, bilateral: Secondary | ICD-10-CM

## 2021-05-16 DIAGNOSIS — H35033 Hypertensive retinopathy, bilateral: Secondary | ICD-10-CM

## 2021-05-16 NOTE — Progress Notes (Signed)
[facility] Note  05/16/2021     CHIEF COMPLAINT Patient presents for Retina Evaluation   HISTORY OF PRESENT ILLNESS: Randy Benson is a 50 y.o. male who presents to the clinic today for:   HPI     Retina Evaluation   In right eye.  Duration of 6 hours.  I, the attending physician,  performed the HPI with the patient and updated documentation appropriately.        Comments   New ret eval, Dr. Zigmund Daniel patient, states he woke up this morning with black dots in vision OD. Pt reports he has been through a med change for his diabetes recently and PCP had notice levels starting to increase. Medication was started 2 months ago. Blood sugar this morning was 170. Most recent A1C around 6. He does report vision in OD to be blurry. He reports a line in his vision OD as well.       Last edited by Bernarda Caffey, MD on 05/20/2021 10:48 PM.    Dr. Zigmund Daniel pt here for floaters in vision OD, pt states he was driving to work this morning and noticed "strings" in his vision, he states he has had this before, but it has been about a year, pt states he has had a recent medication change for his diabetes bc his numbers were going up, he states his blood pressure always runs a little high  Referring physician: Etter [place], [place] [address]  HISTORICAL INFORMATION:   Selected notes from the MEDICAL RECORD NUMBER Dr. Zigmund Daniel pt here for [facility] LEE:  Ocular Hx- PMH-    CURRENT MEDICATIONS: Current Outpatient Medications (Ophthalmic Drugs)  Medication Sig   bacitracin-polymyxin b (POLYSPORIN) ophthalmic ointment Place 1 application into the left eye 3 (three) times daily. apply to eye every 12 hours while awake   gatifloxacin (ZYMAXID) 0.5 % SOLN Place 1 drop into the left eye 4 (four) times daily.   prednisoLONE acetate (PRED FORTE) 1 % ophthalmic suspension Place 1 drop into the left eye 4 (four) times daily.   No current  facility-administered medications for this visit. (Ophthalmic Drugs)   Current Outpatient Medications (Other)  Medication Sig   Amlodipine-Valsartan-HCTZ 10-320-25 MG TABS Take 1 tablet by mouth daily.   aspirin EC 81 MG tablet Take 81 mg by mouth daily.   atorvastatin (LIPITOR) 10 MG tablet Take 1 tablet (10 mg total) by mouth daily at 6 PM.   insulin glargine (LANTUS) 100 UNIT/ML injection Inject 10 Units into the skin daily as needed (Blood sugar over 200).    metFORMIN (GLUCOPHAGE) 1000 MG tablet Take 1,000 mg by mouth 2 (two) times daily with a meal.   No current facility-administered medications for this visit. (Other)   REVIEW OF SYSTEMS: ROS   Positive for: Endocrine, Cardiovascular, Eyes Negative for: Constitutional, Gastrointestinal, Neurological, Skin, Genitourinary, Musculoskeletal, HENT, Respiratory, Psychiatric, Allergic/Imm, Heme/Lymph Last edited by Kingsley Spittle, COT on 05/16/2021 10:25 AM.     ALLERGIES No Known Allergies  PAST MEDICAL HISTORY Past Medical History:  Diagnosis Date   Diabetes mellitus without complication ([place])    Diabetic retinopathy ([place])    Hypertension    Hypertensive retinopathy    Past Surgical History:  Procedure Laterality Date   GAS/FLUID EXCHANGE Left 11/08/2018   Procedure: Gas/Fluid Exchange;  Surgeon: Hayden Pedro, MD;  Location: [place];  Service: Ophthalmology;  Laterality: Left;   NO PAST SURGERIES  PARS PLANA VITRECTOMY 27 GAUGE Left 11/08/2018   Procedure: PARS PLANA VITRECTOMY 27 GAUGE;  Surgeon: Hayden Pedro, MD;  Location: [place];  Service: Ophthalmology;  Laterality: Left;    FAMILY HISTORY Family History  Problem Relation Age of Onset   Diabetes Father    Diabetes Brother    Diabetes Maternal Grandmother     SOCIAL HISTORY Social History   Tobacco Use   Smoking status: Never   Smokeless tobacco: Never  Vaping Use   Vaping Use: Never used  Substance Use Topics   Alcohol use: No   Drug use: No          OPHTHALMIC EXAM:  Base Eye Exam     Visual Acuity (Snellen - Linear)       Right Left   Dist [place] CF @ 3' 20/80 -2   Dist ph [place] NI 20/30 -2    Correction: Glasses         Tonometry (Tonopen, 10:37 AM)       Right Left   Pressure 8 8         Pupils       Dark Light Shape React APD   Right 2 1 Round Minimal None   Left 3 2 Round Minimal None         Visual Fields       Left Right    Full    Restrictions  Partial outer superior temporal, inferior temporal, superior nasal, inferior nasal deficiencies         Extraocular Movement       Right Left    Full, Ortho Full, Ortho         Neuro/Psych     Oriented x3: Yes   Mood/Affect: Normal         Dilation     Both eyes: 1.0% Mydriacyl, 2.5% Phenylephrine @ 10:37 AM           Slit Lamp and Fundus Exam     Slit Lamp Exam       Right Left   Lids/Lashes Normal Normal   Conjunctiva/Sclera mild melanosis, mild temporal pinguecula mild melanosis, mild temporal pinguecula   Cornea 1+ inferior Punctate epithelial erosions 2+ infeiror Punctate epithelial erosions   Anterior Chamber deep, clear, narrow temporal angle deep, clear, narrow temporal angle   Iris Round and dilated, No NVI Round and dilated, No NVI   Lens 2+ Nuclear sclerosis, 2-3+ Cortical cataract 2+ Nuclear sclerosis, 2-3+ Cortical cataract   Vitreous Vitreous syneresis, blood stained vitreous condensations post vitrectomy, clear         Fundus Exam       Right Left   Disc hazy view, perfused mild Pallor, Sharp rim   C/D Ratio  0.3   Macula obscured by heme grossly flat Flat, Blunted foveal reflex, scattered MA, focal laser changes   Vessels  attenuated   Periphery Attached, hazy view, 360 PRP visible, pre-retinal heme settled inferiorly Attached, dense 360 PRP              Refraction     Wearing Rx       Sphere Cylinder Axis   Right -1.00 +2.00 117   Left -1.50 +0.75 026            IMAGING AND  PROCEDURES  Imaging and Procedures for 05/16/2021  OCT, Retina - OU - Both Eyes       Right Eye Quality was poor. Central Foveal Thickness: 1075. Progression has worsened.  Findings include (Very poor image quality, no details visible, retina grossly attached).   Left Eye Quality was good. Central Foveal Thickness: 249. Progression has been stable. Findings include normal foveal contour, no IRF, no SRF, outer retinal atrophy (Trace IRHM and cystic changes).   Notes *Images captured and stored on drive  Diagnosis / Impression:  OD: vitreous opacities / hemorrhage obscuring line scan details OS: NFP; no IRF/SRF --Trace IRHM and cystic changes  Clinical management:  See below  Abbreviations: NFP - Normal foveal profile. CME - cystoid macular edema. PED - pigment epithelial detachment. IRF - intraretinal fluid. SRF - subretinal fluid. EZ - ellipsoid zone. ERM - epiretinal membrane. ORA - outer retinal atrophy. ORT - outer retinal tubulation. SRHM - subretinal hyper-reflective material. IRHM - intraretinal hyper-reflective material      Intravitreal Injection, Pharmacologic Agent - OD - Right Eye       Time Out 05/16/2021. 11:40 AM. Confirmed correct patient, procedure, site, and patient consented.   Anesthesia Topical anesthesia was used. Anesthetic medications included Lidocaine 2%, Proparacaine 0.5%.   Procedure Preparation included 5% betadine to ocular surface, eyelid speculum. A supplied needle was used.   Injection: 1.25 mg Bevacizumab 1.40m/0.05ml   Route: Intravitreal, Site: Right Eye   NDC: 50242-060-01, Lot:: [id number] Expiration date: 06/10/2021, Waste: 0.05 mL   Post-op Post injection exam found visual acuity of at least counting fingers. The patient tolerated the procedure well. There were no complications. The patient received written and verbal post procedure care education.            ASSESSMENT/PLAN:    ICD-10-CM   1. Vitreous hemorrhage of right eye  (HCC)  H43.11 Intravitreal Injection, Pharmacologic Agent - OD - Right Eye    Bevacizumab (AVASTIN) SOLN 1.25 mg    2. Stable proliferative diabetic retinopathy of both eyes associated with type 2 diabetes mellitus (HCC)  ED98.3382Intravitreal Injection, Pharmacologic Agent - OD - Right Eye    Bevacizumab (AVASTIN) SOLN 1.25 mg    3. Retinal edema  H35.81 OCT, Retina - OU - Both Eyes    4. Essential hypertension  I10     5. Hypertensive retinopathy of both eyes  H35.033     6. Combined forms of age-related cataract of both eyes  H25.813       Vitreous Hemorrhage OD - new onset VH noticed while driving to work this morning - recently underwent diabetic medication change due to high numbers - discussed findings, prognosis and possible treatment options - recommend IVA OD today, 8.26.22, to expedite VH clearance -- pt wishes to undergo injection  - RBA of procedure discussed, questions answered - informed consent obtained and signed - see procedure note  - VH precautions reviewed -- minimize activities, keep head elevated, avoid ASA/NSAIDs/blood thinners as able - f/u 1 week -- f/u VH OD -- DFE, OCT  2,3. Proliferative diabetic retinopathy w/o DME, OU  - pt of Dr. MZigmund Daniel- s/p MPC OS (10.16.17) - s/p PRP OS (11.06.17) - s/p PRP OD (11.16.17), 11.26.19) - s/p 27g PPV/EL/IVA/FAX OS (02.18.20) - s/p IVA OS x7 (last one 11.16.20) - exam shows new onset VH OD as above; OS stable with good laser in place  - OCT without diabetic macular edema, both eyes   4,5. Hypertensive retinopathy OU - discussed importance of tight BP control - monitor  6. Mixed Cataract OU - The symptoms of cataract, surgical options, and treatments and risks were discussed with patient. - discussed diagnosis and progression -  not yet visually significant - monitor for now  Ophthalmic Meds Ordered this visit:  Meds ordered this encounter  Medications   Bevacizumab (AVASTIN) SOLN 1.25 mg      Return  in about 1 week (around 05/23/2021) for f/u VH OD, DFE, OCT.  There are no Patient Instructions on file for this visit.  This document serves as a record of services personally performed by Gardiner Sleeper, MD, PhD. It was created on their behalf by Orvan Falconer, an ophthalmic technician. The creation of this record is the provider's dictation and/or activities during the visit.    Electronically signed by: Orvan Falconer, OA, 05/20/21  10:58 PM   This document serves as a record of services personally performed by Gardiner Sleeper, MD, PhD. It was created on their behalf by San Jetty. Owens Shark, OA an ophthalmic technician. The creation of this record is the provider's dictation and/or activities during the visit.    Electronically signed by: San Jetty. Owens Shark, [place] 08.26.2022 10:58 PM   Explained the diagnoses, plan, and follow up with the patient and they expressed understanding.  Patient expressed understanding of the importance of proper follow up care.   Gardiner Sleeper, M.D., Ph.D. Diseases & Surgery of the Retina and Vitreous [facility]  I have reviewed the above documentation for accuracy and completeness, and I agree with the above. Gardiner Sleeper, M.D., Ph.D. 05/20/21 10:58 PM   Abbreviations: M myopia (nearsighted); A astigmatism; H hyperopia (farsighted); P presbyopia; Mrx spectacle prescription;  CTL contact lenses; OD right eye; OS left eye; OU both eyes  XT exotropia; ET esotropia; PEK punctate epithelial keratitis; PEE punctate epithelial erosions; DES dry eye syndrome; MGD meibomian gland dysfunction; ATs artificial tears; PFAT's preservative free artificial tears; [place] nuclear sclerotic cataract; PSC posterior subcapsular cataract; ERM epi-retinal membrane; PVD posterior vitreous detachment; RD retinal detachment; DM diabetes mellitus; DR diabetic retinopathy; NPDR non-proliferative diabetic retinopathy; PDR proliferative diabetic retinopathy; CSME  clinically significant macular edema; DME diabetic macular edema; dbh dot blot hemorrhages; CWS cotton wool spot; POAG primary open angle glaucoma; C/D cup-to-disc ratio; HVF humphrey visual field; GVF goldmann visual field; OCT optical coherence tomography; IOP intraocular pressure; BRVO Branch retinal vein occlusion; CRVO central retinal vein occlusion; CRAO central retinal artery occlusion; BRAO branch retinal artery occlusion; RT retinal tear; SB scleral buckle; PPV pars plana vitrectomy; VH Vitreous hemorrhage; PRP panretinal laser photocoagulation; IVK intravitreal kenalog; VMT vitreomacular traction; MH Macular hole;  NVD neovascularization of the disc; NVE neovascularization elsewhere; AREDS age related eye disease study; ARMD age related macular degeneration; POAG primary open angle glaucoma; EBMD epithelial/anterior basement membrane dystrophy; ACIOL anterior chamber intraocular lens; IOL intraocular lens; PCIOL posterior chamber intraocular lens; Phaco/IOL phacoemulsification with intraocular lens placement; [place] photorefractive keratectomy; LASIK laser assisted in situ keratomileusis; HTN hypertension; DM diabetes mellitus; COPD chronic obstructive pulmonary disease

## 2021-05-20 ENCOUNTER — Encounter (INDEPENDENT_AMBULATORY_CARE_PROVIDER_SITE_OTHER): Payer: Self-pay | Admitting: Ophthalmology

## 2021-05-20 MED ORDER — BEVACIZUMAB CHEMO INJECTION 1.25MG/0.05ML SYRINGE FOR KALEIDOSCOPE
1.2500 mg | INTRAVITREAL | Status: AC | PRN
  Administered 2021-05-16: 1.25 mg via INTRAVITREAL

## 2021-05-21 NOTE — Progress Notes (Signed)
[facility] - Clinic Note  05/23/2021     CHIEF COMPLAINT Patient presents for Retina Follow Up   HISTORY OF PRESENT ILLNESS: Randy Benson is a 50 y.o. male who presents to the clinic today for:   HPI     Retina Follow Up   Patient presents with  Other.  In right eye.  This started 1 week ago.  I, the attending physician,  performed the HPI with the patient and updated documentation appropriately.        Comments   Patient here for 1 weeks retina follow up for VH OD. Patient states vision doing better. Trying to clear up. Still blurry OD. No eye pain.      Last edited by [name], MD on 05/23/2021  9:10 PM.    Pt states blood is clearing, but vision is still blurry, he states he has been sleeping with his head elevated, he went back to work on Tuesday and does not feel like his vision is any worse while working   Referring physician: [name], FNP [address] [place],  [place] [address]  HISTORICAL INFORMATION:   Selected notes from the MEDICAL RECORD NUMBER Dr. [name] pt here for [facility]:  Ocular Hx- PMH-    CURRENT MEDICATIONS: Current Outpatient Medications (Ophthalmic Drugs)  Medication Sig   bacitracin-polymyxin b (POLYSPORIN) ophthalmic ointment Place 1 application into the left eye 3 (three) times daily. apply to eye every 12 hours while awake   gatifloxacin (ZYMAXID) 0.5 % SOLN Place 1 drop into the left eye 4 (four) times daily.   prednisoLONE acetate (PRED FORTE) 1 % ophthalmic suspension Place 1 drop into the left eye 4 (four) times daily.   No current facility-administered medications for this visit. (Ophthalmic Drugs)   Current Outpatient Medications (Other)  Medication Sig   Amlodipine-Valsartan-HCTZ 10-320-25 MG TABS Take 1 tablet by mouth daily.   aspirin EC 81 MG tablet Take 81 mg by mouth daily.   atorvastatin (LIPITOR) 10 MG tablet Take 1 tablet (10 mg total) by mouth daily at 6 PM.   insulin glargine (LANTUS)  100 UNIT/ML injection Inject 10 Units into the skin daily as needed (Blood sugar over 200).    metFORMIN (GLUCOPHAGE) 1000 MG tablet Take 1,000 mg by mouth 2 (two) times daily with a meal.   No current facility-administered medications for this visit. (Other)   REVIEW OF SYSTEMS: ROS   Positive for: Endocrine, Cardiovascular, Eyes Negative for: Constitutional, Gastrointestinal, Neurological, Skin, Genitourinary, Musculoskeletal, HENT, Respiratory, Psychiatric, Allergic/Imm, Heme/Lymph Last edited by Laddie Aquas, COA on 05/23/2021 10:15 AM.      ALLERGIES No Known Allergies  PAST MEDICAL HISTORY Past Medical History:  Diagnosis Date   Diabetes mellitus without complication (HCC)    Diabetic retinopathy (HCC)    Hypertension    Hypertensive retinopathy    Past Surgical History:  Procedure Laterality Date   GAS/FLUID EXCHANGE Left 11/08/2018   Procedure: Gas/Fluid Exchange;  Surgeon: Sherrie George, MD;  Location: [facility] OR;  Service: Ophthalmology;  Laterality: Left;   NO PAST SURGERIES     PARS PLANA VITRECTOMY 27 GAUGE Left 11/08/2018   Procedure: PARS PLANA VITRECTOMY 27 GAUGE;  Surgeon: Sherrie George, MD;  Location: [facility] OR;  Service: Ophthalmology;  Laterality: Left;    FAMILY HISTORY Family History  Problem Relation Age of Onset   Diabetes Father    Diabetes Brother    Diabetes Maternal Grandmother     SOCIAL  HISTORY Social History   Tobacco Use   Smoking status: Never   Smokeless tobacco: Never  Vaping Use   Vaping Use: Never used  Substance Use Topics   Alcohol use: No   Drug use: No         OPHTHALMIC EXAM:  Base Eye Exam     Visual Acuity (Snellen - Linear)       Right Left   Dist [place] 20/250 -1 20/80   Dist ph [place] 20/200 -1 20/40 +1  Patient forgot glasses        Tonometry (Tonopen, 10:11 AM)       Right Left   Pressure 08 11         Pupils       Dark Light Shape React APD   Right 2 1 Round Minimal None   Left 2 1 Round  Minimal None         Visual Fields       Left Right    Full    Restrictions  Partial outer superior temporal, inferior temporal, superior nasal, inferior nasal deficiencies         Extraocular Movement       Right Left    Full, Ortho Full, Ortho         Neuro/Psych     Oriented x3: Yes   Mood/Affect: Normal         Dilation     Both eyes: 1.0% Mydriacyl, 2.5% Phenylephrine @ 10:11 AM           Slit Lamp and Fundus Exam     Slit Lamp Exam       Right Left   Lids/Lashes Normal Normal   Conjunctiva/Sclera mild melanosis, mild temporal pinguecula mild melanosis, mild temporal pinguecula   Cornea trace Punctate epithelial erosions 2+ infeiror Punctate epithelial erosions   Anterior Chamber deep, clear, narrow temporal angle deep, clear, narrow temporal angle   Iris Round and dilated, No NVI Round and dilated, No NVI   Lens 2+ Nuclear sclerosis, 2-3+ Cortical cataract 2+ Nuclear sclerosis, 2-3+ Cortical cataract   Vitreous Vitreous syneresis, blood stained vitreous condensations clearing post vitrectomy, clear         Fundus Exam       Right Left   Disc hazy view improved, perfused mild Pallor, Sharp rim   C/D Ratio  0.3   Macula hazy view improved, grossly flat Flat, Blunted foveal reflex, scattered MA, focal laser changes   Vessels attenuated attenuated   Periphery Attached, hazy view, 360 PRP visible, pre-retinal heme settled inferiorly, scattered DBH Attached, dense 360 PRP              Refraction     Wearing Rx       Sphere Cylinder Axis   Right -1.00 +2.00 117   Left -1.50 +0.75 026            IMAGING AND PROCEDURES  Imaging and Procedures for 05/23/2021  OCT, Retina - OU - Both Eyes       Right Eye Quality was poor. Central Foveal Thickness: 237. Progression has improved. Findings include abnormal foveal contour, intraretinal fluid, no SRF (Interval improvement in vitreous opacities, mild diffuse atrophy, irregular foveal  contour/lamination, +cystic changes).   Left Eye Quality was good. Central Foveal Thickness: 256. Progression has been stable. Findings include normal foveal contour, no IRF, no SRF, outer retinal atrophy (Trace IRHM and cystic changes).   Notes *Images captured and stored on drive  Diagnosis / Impression:  OD: Interval improvement in vitreous opacities, mild diffuse atrophy, irregular foveal contour/lamination, +cystic changes OS: NFP; no IRF/SRF --Trace IRHM and cystic changes  Clinical management:  See below  Abbreviations: NFP - Normal foveal profile. CME - cystoid macular edema. PED - pigment epithelial detachment. IRF - intraretinal fluid. SRF - subretinal fluid. EZ - ellipsoid zone. ERM - epiretinal membrane. ORA - outer retinal atrophy. ORT - outer retinal tubulation. SRHM - subretinal hyper-reflective material. IRHM - intraretinal hyper-reflective material             ASSESSMENT/PLAN:    ICD-10-CM   1. Vitreous hemorrhage of right eye (HCC)  H43.11     2. Stable proliferative diabetic retinopathy of both eyes associated with type 2 diabetes mellitus (HCC)  B76.2831     3. Retinal edema  H35.81 OCT, Retina - OU - Both Eyes    4. Essential hypertension  I10     5. Hypertensive retinopathy of both eyes  H35.033     6. Combined forms of age-related cataract of both eyes  H25.813       Vitreous Hemorrhage OD - new onset VH noticed while driving to work on 51.76.16 - recently underwent diabetic medication change due to high numbers - s/p IVA OD #1 (08.26.22) - today, VH improving - BCVA 20/200 from CF - VH precautions reviewed -- minimize activities, keep head elevated, avoid ASA/NSAIDs/blood thinners as able - f/u 1-2 week -- f/u VH OD -- DFE, OCT  2,3. Proliferative diabetic retinopathy w/o DME, OU  - pt of Dr. [name] - s/p MPC OS (10.16.17) - s/p PRP OS (11.06.17) - s/p PRP OD (11.16.17), 11.26.19) - s/p 27g PPV/EL/IVA/FAX OS (02.18.20) - s/p IVA OS x7  (last one 11.16.20) - exam shows new onset VH OD as above; OS stable with good laser in place  - OCT without diabetic macular edema, both eyes   4,5. Hypertensive retinopathy OU - discussed importance of tight BP control - monitor  6. Mixed Cataract OU - The symptoms of cataract, surgical options, and treatments and risks were discussed with patient. - discussed diagnosis and progression - not yet visually significant - monitor for now  Ophthalmic Meds Ordered this visit:  No orders of the defined types were placed in this encounter.     Return for f/u 1-2 weeks, VH OD, DFE, OCT.  There are no Patient Instructions on file for this visit.  Explained the diagnoses, plan, and follow up with the patient and they expressed understanding.  Patient expressed understanding of the importance of proper follow up care.   This document serves as a record of services personally performed by Karie Chimera, MD, PhD. It was created on their behalf by Glee Arvin. Manson Passey, OA an ophthalmic technician. The creation of this record is the provider's dictation and/or activities during the visit.    Electronically signed by: Glee Arvin. Manson Passey, [place] 08.31.2022 9:11 PM  Karie Chimera, M.D., Ph.D. Diseases & Surgery of the Retina and Vitreous [facility]  I have reviewed the above documentation for accuracy and completeness, and I agree with the above. Karie Chimera, M.D., Ph.D. 05/23/21 9:14 PM    Abbreviations: M myopia (nearsighted); A astigmatism; H hyperopia (farsighted); P presbyopia; Mrx spectacle prescription;  CTL contact lenses; OD right eye; OS left eye; OU both eyes  XT exotropia; ET esotropia; PEK punctate epithelial keratitis; PEE punctate epithelial erosions; DES dry eye syndrome; MGD meibomian gland dysfunction;  ATs artificial tears; PFAT's preservative free artificial tears; NSC nuclear sclerotic cataract; PSC posterior subcapsular cataract; ERM epi-retinal membrane;  PVD posterior vitreous detachment; RD retinal detachment; DM diabetes mellitus; DR diabetic retinopathy; NPDR non-proliferative diabetic retinopathy; PDR proliferative diabetic retinopathy; CSME clinically significant macular edema; DME diabetic macular edema; dbh dot blot hemorrhages; CWS cotton wool spot; POAG primary open angle glaucoma; C/D cup-to-disc ratio; HVF humphrey visual field; GVF goldmann visual field; OCT optical coherence tomography; IOP intraocular pressure; BRVO Branch retinal vein occlusion; CRVO central retinal vein occlusion; CRAO central retinal artery occlusion; BRAO branch retinal artery occlusion; RT retinal tear; SB scleral buckle; PPV pars plana vitrectomy; VH Vitreous hemorrhage; PRP panretinal laser photocoagulation; IVK intravitreal kenalog; VMT vitreomacular traction; MH Macular hole;  NVD neovascularization of the disc; NVE neovascularization elsewhere; AREDS age related eye disease study; ARMD age related macular degeneration; POAG primary open angle glaucoma; EBMD epithelial/anterior basement membrane dystrophy; ACIOL anterior chamber intraocular lens; IOL intraocular lens; PCIOL posterior chamber intraocular lens; Phaco/IOL phacoemulsification with intraocular lens placement; PRK photorefractive keratectomy; LASIK laser assisted in situ keratomileusis; HTN hypertension; DM diabetes mellitus; COPD chronic obstructive pulmonary disease

## 2021-05-23 ENCOUNTER — Other Ambulatory Visit: Payer: Self-pay

## 2021-05-23 ENCOUNTER — Encounter (INDEPENDENT_AMBULATORY_CARE_PROVIDER_SITE_OTHER): Payer: Self-pay | Admitting: Ophthalmology

## 2021-05-23 ENCOUNTER — Ambulatory Visit (INDEPENDENT_AMBULATORY_CARE_PROVIDER_SITE_OTHER): Payer: 59 | Admitting: Ophthalmology

## 2021-05-23 DIAGNOSIS — E113553 Type 2 diabetes mellitus with stable proliferative diabetic retinopathy, bilateral: Secondary | ICD-10-CM | POA: Diagnosis not present

## 2021-05-23 DIAGNOSIS — I10 Essential (primary) hypertension: Secondary | ICD-10-CM | POA: Diagnosis not present

## 2021-05-23 DIAGNOSIS — H4311 Vitreous hemorrhage, right eye: Secondary | ICD-10-CM | POA: Diagnosis not present

## 2021-05-23 DIAGNOSIS — H3581 Retinal edema: Secondary | ICD-10-CM | POA: Diagnosis not present

## 2021-05-23 DIAGNOSIS — H35033 Hypertensive retinopathy, bilateral: Secondary | ICD-10-CM

## 2021-05-23 DIAGNOSIS — H25813 Combined forms of age-related cataract, bilateral: Secondary | ICD-10-CM

## 2021-06-05 NOTE — Progress Notes (Signed)
[facility] - Clinic Note  06/06/2021     CHIEF COMPLAINT Patient presents for Retina Follow Up   HISTORY OF PRESENT ILLNESS: Randy Benson is a 50 y.o. male who presents to the clinic today for:  HPI     Retina Follow Up   Patient presents with  Diabetic Retinopathy.  In both eyes.  This started 2 weeks ago.  I, the attending physician,  performed the HPI with the patient and updated documentation appropriately.        Comments   Patient here for 2 weeks retina follow up for [facility] OD PDR OU. Patient states vision doing better. No eye pain.       Last edited by [name], MD on 06/08/2021  9:40 PM.    Pt feels his vision OD continues to gradually improve.  Referring physician: [name], FNP [address] [place],  [place] [address]  HISTORICAL INFORMATION:   Selected notes from the MEDICAL RECORD NUMBER Dr. [name] pt here for [facility]   CURRENT MEDICATIONS: Current Outpatient Medications (Ophthalmic Drugs)  Medication Sig   bacitracin-polymyxin b (POLYSPORIN) ophthalmic ointment Place 1 application into the left eye 3 (three) times daily. apply to eye every 12 hours while awake   gatifloxacin (ZYMAXID) 0.5 % SOLN Place 1 drop into the left eye 4 (four) times daily.   prednisoLONE acetate (PRED FORTE) 1 % ophthalmic suspension Place 1 drop into the left eye 4 (four) times daily.   No current facility-administered medications for this visit. (Ophthalmic Drugs)   Current Outpatient Medications (Other)  Medication Sig   Amlodipine-Valsartan-HCTZ 10-320-25 MG TABS Take 1 tablet by mouth daily.   aspirin EC 81 MG tablet Take 81 mg by mouth daily.   atorvastatin (LIPITOR) 10 MG tablet Take 1 tablet (10 mg total) by mouth daily at 6 PM.   insulin glargine (LANTUS) 100 UNIT/ML injection Inject 10 Units into the skin daily as needed (Blood sugar over 200).    metFORMIN (GLUCOPHAGE) 1000 MG tablet Take 1,000 mg by mouth 2 (two) times daily with a meal.    No current facility-administered medications for this visit. (Other)   REVIEW OF SYSTEMS: ROS   Positive for: Endocrine, Cardiovascular, Eyes Negative for: Constitutional, Gastrointestinal, Neurological, Skin, Genitourinary, Musculoskeletal, HENT, Respiratory, Psychiatric, Allergic/Imm, Heme/Lymph Last edited by Laddie Aquas, COA on 06/06/2021  9:51 AM.       ALLERGIES No Known Allergies  PAST MEDICAL HISTORY Past Medical History:  Diagnosis Date   Diabetes mellitus without complication (HCC)    Diabetic retinopathy (HCC)    Hypertension    Hypertensive retinopathy    Past Surgical History:  Procedure Laterality Date   GAS/FLUID EXCHANGE Left 11/08/2018   Procedure: Gas/Fluid Exchange;  Surgeon: Sherrie George, MD;  Location: [facility] OR;  Service: Ophthalmology;  Laterality: Left;   NO PAST SURGERIES     PARS PLANA VITRECTOMY 27 GAUGE Left 11/08/2018   Procedure: PARS PLANA VITRECTOMY 27 GAUGE;  Surgeon: Sherrie George, MD;  Location: [facility] OR;  Service: Ophthalmology;  Laterality: Left;    FAMILY HISTORY Family History  Problem Relation Age of Onset   Diabetes Father    Diabetes Brother    Diabetes Maternal Grandmother     SOCIAL HISTORY Social History   Tobacco Use   Smoking status: Never   Smokeless tobacco: Never  Vaping Use   Vaping Use: Never used  Substance Use Topics   Alcohol use: No   Drug use:  No         OPHTHALMIC EXAM:  Base Eye Exam     Visual Acuity (Snellen - Linear)       Right Left   Dist cc 20/150 -1 20/70 +1   Dist ph cc 20/150 +2 20/40 -2    Correction: Glasses         Tonometry (Tonopen, 9:49 AM)       Right Left   Pressure 12 13         Pupils       Dark Light Shape React APD   Right 2 1 Round Minimal None   Left 2 1 Round Minimal None         Visual Fields       Left Right    Full    Restrictions  Partial outer superior temporal, inferior temporal, superior nasal, inferior nasal deficiencies          Extraocular Movement       Right Left    Full Full         Neuro/Psych     Oriented x3: Yes   Mood/Affect: Normal         Dilation     Both eyes: 1.0% Mydriacyl, 2.5% Phenylephrine @ 9:49 AM           Slit Lamp and Fundus Exam     Slit Lamp Exam       Right Left   Lids/Lashes Normal Normal   Conjunctiva/Sclera mild melanosis, mild temporal pinguecula mild melanosis, mild temporal pinguecula   Cornea trace Punctate epithelial erosions 1+ infeiror Punctate epithelial erosions   Anterior Chamber deep, clear, narrow temporal angle deep, clear, narrow temporal angle   Iris Round and dilated, No NVI Round and dilated, No NVI   Lens 2+ Nuclear sclerosis, 2-3+ Cortical cataract 2+ Nuclear sclerosis, 2-3+ Cortical cataract   Vitreous Vitreous syneresis, blood stained vitreous condensations clearing post vitrectomy, clear         Fundus Exam       Right Left   Disc hazy view improved, 2+ pallor, sharp rim mild Pallor, Sharp rim   C/D Ratio 0.4 0.3   Macula hazy view improved, grossly flat, blunted foveal reflex, focal laser changes, trace cystic changes, scattered MA Flat, Blunted foveal reflex, scattered MA, focal laser changes   Vessels attenuated, tortuous attenuated   Periphery Attached, hazy view, 360 PRP visible, pre-retinal heme settled inferiorly, scattered DBH, prominent pre-retinal hemes superiorly Attached, dense 360 PRP              Refraction     Wearing Rx       Sphere Cylinder Axis   Right -1.00 +2.00 117   Left -1.50 +0.75 026            IMAGING AND PROCEDURES  Imaging and Procedures for 06/06/2021  OCT, Retina - OU - Both Eyes       Right Eye Quality was good. Central Foveal Thickness: 235. Progression has improved. Findings include abnormal foveal contour, intraretinal fluid, no SRF (Interval improvement in vitreous opacities, mild diffuse atrophy, irregular foveal contour/lamination, +cystic changes--slightly improved).   Left  Eye Quality was good. Central Foveal Thickness: 247. Progression has been stable. Findings include normal foveal contour, no IRF, no SRF, outer retinal atrophy (Trace IRHM and cystic changes).   Notes *Images captured and stored on drive  Diagnosis / Impression:  OD: Interval improvement in vitreous opacities, mild diffuse atrophy, irregular foveal contour/lamination, +cystic changes--slightly  improved OS: NFP; no IRF/SRF --Trace IRHM and cystic changes  Clinical management:  See below  Abbreviations: NFP - Normal foveal profile. CME - cystoid macular edema. PED - pigment epithelial detachment. IRF - intraretinal fluid. SRF - subretinal fluid. EZ - ellipsoid zone. ERM - epiretinal membrane. ORA - outer retinal atrophy. ORT - outer retinal tubulation. SRHM - subretinal hyper-reflective material. IRHM - intraretinal hyper-reflective material            ASSESSMENT/PLAN:    ICD-10-CM   1. Vitreous hemorrhage of right eye (HCC)  H43.11     2. Stable proliferative diabetic retinopathy of both eyes associated with type 2 diabetes mellitus (HCC)  B35.3299     3. Retinal edema  H35.81 OCT, Retina - OU - Both Eyes    4. Essential hypertension  I10     5. Hypertensive retinopathy of both eyes  H35.033     6. Combined forms of age-related cataract of both eyes  H25.813      Vitreous Hemorrhage OD - new onset VH noticed while driving to work on 24.26.83 - recently underwent diabetic medication change due to high numbers - s/p IVA OD #1 (08.26.22) - today, VH improving - BCVA 20/150 from 20/200 - VH precautions reviewed -- minimize activities, keep head elevated, avoid ASA/NSAIDs/blood thinners as able - f/u 1-2 wks -- f/u VH OD -- DFE, OCT  2,3. Proliferative diabetic retinopathy w/o DME, OU  - pt of Dr. [name] - s/p MPC OS (10.16.17) - s/p PRP OS (11.06.17) - s/p PRP OD (11.16.17), 11.26.19) - s/p 27g PPV/EL/IVA/FAX OS (02.18.20) - s/p IVA OS x7 (last one 11.16.20) - exam  shows new onset VH OD as above; OS stable with good laser in place  - OCT without diabetic macular edema OS, mild cystic changes OD  4,5. Hypertensive retinopathy OU - discussed importance of tight BP control - monitor   6. Mixed Cataract OU - The symptoms of cataract, surgical options, and treatments and risks were discussed with patient. - discussed diagnosis and progression - not yet visually significant - monitor for now  Ophthalmic Meds Ordered this visit:  No orders of the defined types were placed in this encounter.     Return for 1-2 wk f/u for [facility] OD w/DFE/OCT/poss. inj OD.  There are no Patient Instructions on file for this visit.  Explained the diagnoses, plan, and follow up with the patient and they expressed understanding.  Patient expressed understanding of the importance of proper follow up care.   This document serves as a record of services personally performed by Karie Chimera, MD, PhD. It was created on their behalf by Joni Reining, an ophthalmic technician. The creation of this record is the provider's dictation and/or activities during the visit.    Electronically signed by: Joni Reining COA, 06/08/21  9:41 PM  This document serves as a record of services personally performed by Karie Chimera, MD, PhD. It was created on their behalf by Cristopher Estimable, COT an ophthalmic technician. The creation of this record is the provider's dictation and/or activities during the visit.    Electronically signed by: Cristopher Estimable, [place] 9.16.22 @ 9:41 PM   Karie Chimera, M.D., Ph.D. Diseases & Surgery of the Retina and Vitreous [facility] 06/06/2021  I have reviewed the above documentation for accuracy and completeness, and I agree with the above. Karie Chimera, M.D., Ph.D. 06/08/21 9:42 PM   Abbreviations: M myopia (nearsighted); A astigmatism;  H hyperopia (farsighted); P presbyopia; Mrx spectacle prescription;  CTL contact lenses; OD right  eye; OS left eye; OU both eyes  XT exotropia; ET esotropia; PEK punctate epithelial keratitis; PEE punctate epithelial erosions; DES dry eye syndrome; MGD meibomian gland dysfunction; ATs artificial tears; PFAT's preservative free artificial tears; NSC nuclear sclerotic cataract; PSC posterior subcapsular cataract; ERM epi-retinal membrane; PVD posterior vitreous detachment; RD retinal detachment; DM diabetes mellitus; DR diabetic retinopathy; NPDR non-proliferative diabetic retinopathy; PDR proliferative diabetic retinopathy; CSME clinically significant macular edema; DME diabetic macular edema; dbh dot blot hemorrhages; CWS cotton wool spot; POAG primary open angle glaucoma; C/D cup-to-disc ratio; HVF humphrey visual field; GVF goldmann visual field; OCT optical coherence tomography; IOP intraocular pressure; BRVO Branch retinal vein occlusion; CRVO central retinal vein occlusion; CRAO central retinal artery occlusion; BRAO branch retinal artery occlusion; RT retinal tear; SB scleral buckle; PPV pars plana vitrectomy; VH Vitreous hemorrhage; PRP panretinal laser photocoagulation; IVK intravitreal kenalog; VMT vitreomacular traction; MH Macular hole;  NVD neovascularization of the disc; NVE neovascularization elsewhere; AREDS age related eye disease study; ARMD age related macular degeneration; POAG primary open angle glaucoma; EBMD epithelial/anterior basement membrane dystrophy; ACIOL anterior chamber intraocular lens; IOL intraocular lens; PCIOL posterior chamber intraocular lens; Phaco/IOL phacoemulsification with intraocular lens placement; PRK photorefractive keratectomy; LASIK laser assisted in situ keratomileusis; HTN hypertension; DM diabetes mellitus; COPD chronic obstructive pulmonary disease

## 2021-06-06 ENCOUNTER — Other Ambulatory Visit: Payer: Self-pay

## 2021-06-06 ENCOUNTER — Encounter (INDEPENDENT_AMBULATORY_CARE_PROVIDER_SITE_OTHER): Payer: Self-pay | Admitting: Ophthalmology

## 2021-06-06 ENCOUNTER — Ambulatory Visit (INDEPENDENT_AMBULATORY_CARE_PROVIDER_SITE_OTHER): Payer: 59 | Admitting: Ophthalmology

## 2021-06-06 DIAGNOSIS — H3581 Retinal edema: Secondary | ICD-10-CM

## 2021-06-06 DIAGNOSIS — H35033 Hypertensive retinopathy, bilateral: Secondary | ICD-10-CM | POA: Diagnosis not present

## 2021-06-06 DIAGNOSIS — H25813 Combined forms of age-related cataract, bilateral: Secondary | ICD-10-CM

## 2021-06-06 DIAGNOSIS — I10 Essential (primary) hypertension: Secondary | ICD-10-CM | POA: Diagnosis not present

## 2021-06-06 DIAGNOSIS — E113553 Type 2 diabetes mellitus with stable proliferative diabetic retinopathy, bilateral: Secondary | ICD-10-CM

## 2021-06-06 DIAGNOSIS — H4311 Vitreous hemorrhage, right eye: Secondary | ICD-10-CM | POA: Diagnosis not present

## 2021-06-08 ENCOUNTER — Encounter (INDEPENDENT_AMBULATORY_CARE_PROVIDER_SITE_OTHER): Payer: Self-pay | Admitting: Ophthalmology

## 2021-06-11 NOTE — Progress Notes (Signed)
[facility] Note  06/13/2021     CHIEF COMPLAINT Patient presents for Retina Follow Up   HISTORY OF PRESENT ILLNESS: Randy Benson is a 50 y.o. male who presents to the clinic today for:  HPI     Retina Follow Up   Patient presents with  Other.  In right eye.  Severity is mild.  Duration of 1 week.  Since onset it is gradually improving.  I, the attending physician,  performed the HPI with the patient and updated documentation appropriately.        Comments   Pt here for 1 wk ret f/u for vit heme OD/PDR OU. Pt states vision is doing well, getting better.       Last edited by Bernarda Caffey, MD on 06/14/2021 12:29 AM.     Pt feels his vision OD continues to gradually improve  Referring physician: Etter Sjogren, [place] [id number]  HISTORICAL INFORMATION:   Selected notes from the MEDICAL RECORD NUMBER Dr. Zigmund Daniel pt here for [facility]   CURRENT MEDICATIONS: Current Outpatient Medications (Ophthalmic Drugs)  Medication Sig   bacitracin-polymyxin b (POLYSPORIN) ophthalmic ointment Place 1 application into the left eye 3 (three) times daily. apply to eye every 12 hours while awake   gatifloxacin (ZYMAXID) 0.5 % SOLN Place 1 drop into the left eye 4 (four) times daily.   prednisoLONE acetate (PRED FORTE) 1 % ophthalmic suspension Place 1 drop into the left eye 4 (four) times daily.   No current facility-administered medications for this visit. (Ophthalmic Drugs)   Current Outpatient Medications (Other)  Medication Sig   Amlodipine-Valsartan-HCTZ 10-320-25 MG TABS Take 1 tablet by mouth daily.   aspirin EC 81 MG tablet Take 81 mg by mouth daily.   atorvastatin (LIPITOR) 10 MG tablet Take 1 tablet (10 mg total) by mouth daily at 6 PM.   insulin glargine (LANTUS) 100 UNIT/ML injection Inject 10 Units into the skin daily as needed (Blood sugar over 200).    metFORMIN (GLUCOPHAGE) 1000 MG tablet Take 1,000 mg by mouth 2 (two)  times daily with a meal.   No current facility-administered medications for this visit. (Other)   REVIEW OF SYSTEMS: ROS   Positive for: Endocrine, Cardiovascular, Eyes Negative for: Constitutional, Gastrointestinal, Neurological, Skin, Genitourinary, Musculoskeletal, HENT, Respiratory, Psychiatric, Allergic/Imm, Heme/Lymph Last edited by Kingsley Spittle, COT on 06/13/2021  2:27 PM.      ALLERGIES No Known Allergies  PAST MEDICAL HISTORY Past Medical History:  Diagnosis Date   Diabetes mellitus without complication ([place])    Diabetic retinopathy ([place])    Hypertension    Hypertensive retinopathy    Past Surgical History:  Procedure Laterality Date   GAS/FLUID EXCHANGE Left 11/08/2018   Procedure: Gas/Fluid Exchange;  Surgeon: Hayden Pedro, MD;  Location: [place];  Service: Ophthalmology;  Laterality: Left;   NO PAST SURGERIES     PARS PLANA VITRECTOMY 27 GAUGE Left 11/08/2018   Procedure: PARS PLANA VITRECTOMY 27 GAUGE;  Surgeon: Hayden Pedro, MD;  Location: [facility];  Service: Ophthalmology;  Laterality: Left;    FAMILY HISTORY Family History  Problem Relation Age of Onset   Diabetes Father    Diabetes Brother    Diabetes Maternal Grandmother     SOCIAL HISTORY Social History   Tobacco Use   Smoking status: Never   Smokeless tobacco: Never  Vaping Use   Vaping Use: Never used  Substance Use Topics  Alcohol use: No   Drug use: No       OPHTHALMIC EXAM:  Base Eye Exam     Visual Acuity (Snellen - Linear)       Right Left   Dist cc 20/150 -1 20/60 +1   Dist ph cc NI 20/40 -2    Correction: Glasses         Tonometry (Tonopen, 2:41 PM)       Right Left   Pressure 7 8         Pupils       Dark Light Shape React APD   Right 2 1 Round Minimal None   Left 2 1 Round Minimal None         Visual Fields (Counting fingers)       Left Right    Full Full         Extraocular Movement       Right Left    Full, Ortho Full, Ortho          Neuro/Psych     Oriented x3: Yes   Mood/Affect: Normal         Dilation     Both eyes: 1.0% Mydriacyl, 2.5% Phenylephrine @ 2:41 PM           Slit Lamp and Fundus Exam     Slit Lamp Exam       Right Left   Lids/Lashes Normal Normal   Conjunctiva/Sclera mild melanosis, mild temporal pinguecula mild melanosis, mild temporal pinguecula   Cornea trace Punctate epithelial erosions 1+ infeiror Punctate epithelial erosions   Anterior Chamber deep, clear, narrow temporal angle deep, clear, narrow temporal angle   Iris Round and dilated, No NVI Round and dilated, No NVI   Lens 2+ Nuclear sclerosis, 2-3+ Cortical cataract 2+ Nuclear sclerosis, 2-3+ Cortical cataract   Vitreous Vitreous syneresis, blood stained vitreous condensations clearing and settling inferiorly post vitrectomy, clear         Fundus Exam       Right Left   Disc hazy view improved, 2-3+ pallor, sharp rim mild Pallor, Sharp rim   C/D Ratio 0.4 0.3   Macula hazy view improved, grossly flat, blunted foveal reflex, focal laser changes, trace cystic changes -- stably improved, scattered MA Flat, Blunted foveal reflex, scattered MA, focal laser changes   Vessels attenuated, tortuous attenuated   Periphery Attached, hazy view improving, 360 PRP visible, pre-retinal heme settled inferiorly, scattered DBH, prominent pre-retinal hemes superiorly Attached, dense 360 PRP              Refraction     Wearing Rx       Sphere Cylinder Axis   Right -1.00 +2.00 117   Left -1.50 +0.75 026            IMAGING AND PROCEDURES  Imaging and Procedures for 06/13/2021  OCT, Retina - OU - Both Eyes       Right Eye Quality was good. Central Foveal Thickness: 233. Progression has been stable. Findings include abnormal foveal contour, no SRF, no IRF (Mild Interval improvement in vitreous opacities, mild diffuse atrophy, irregular foveal contour/lamination, +cystic changes--stably improved).   Left Eye Quality  was good. Central Foveal Thickness: 247. Progression has been stable. Findings include normal foveal contour, no IRF, no SRF, outer retinal atrophy (Trace IRHM and cystic changes).   Notes *Images captured and stored on drive  Diagnosis / Impression:  OD: Mild Interval improvement in vitreous opacities, mild diffuse atrophy, irregular foveal  contour/lamination, +cystic changes--stably improved OS: NFP; no IRF/SRF --Trace IRHM and cystic changes  Clinical management:  See below  Abbreviations: NFP - Normal foveal profile. CME - cystoid macular edema. PED - pigment epithelial detachment. IRF - intraretinal fluid. SRF - subretinal fluid. EZ - ellipsoid zone. ERM - epiretinal membrane. ORA - outer retinal atrophy. ORT - outer retinal tubulation. SRHM - subretinal hyper-reflective material. IRHM - intraretinal hyper-reflective material      Intravitreal Injection, Pharmacologic Agent - OD - Right Eye       Time Out 06/13/2021. 3:26 PM. Confirmed correct patient, procedure, site, and patient consented.   Anesthesia Topical anesthesia was used. Anesthetic medications included Lidocaine 2%, Proparacaine 0.5%.   Procedure Preparation included 5% betadine to ocular surface, eyelid speculum. A supplied (32g) needle was used.   Injection: 1.25 mg Bevacizumab 1.49m/0.05ml   Route: Intravitreal, Site: Right Eye   NDC: 50242-060-01, Lot: [id number], Expiration date: 07/13/2021, Waste: 0.05 mL   Post-op Post injection exam found visual acuity of at least counting fingers. The patient tolerated the procedure well. There were no complications. The patient received written and verbal post procedure care education.             ASSESSMENT/PLAN:    ICD-10-CM   1. Vitreous hemorrhage of right eye (HCC)  H43.11 Intravitreal Injection, Pharmacologic Agent - OD - Right Eye    Bevacizumab (AVASTIN) SOLN 1.25 mg    2. Proliferative diabetic retinopathy of both eyes with macular edema associated  with type 2 diabetes mellitus (HCC)  ER91.6384Intravitreal Injection, Pharmacologic Agent - OD - Right Eye    Bevacizumab (AVASTIN) SOLN 1.25 mg    3. Retinal edema  H35.81 OCT, Retina - OU - Both Eyes    4. Essential hypertension  I10     5. Hypertensive retinopathy of both eyes  H35.033     6. Combined forms of age-related cataract of both eyes  H25.813       Vitreous Hemorrhage OD - new onset VH noticed while driving to work on [id number]- recently underwent diabetic medication change due to high numbers - s/p IVA OD #1 (08.26.22) - today, VH improving - BCVA 20/150 -- pt reports subjectively back to baseline  - recommend IVA OD #2 today, 09.23.22 for persistent VH - pt wishes to proceed - RBA of procedure discussed, questions answered - informed consent obtained and signed - see procedure note - VH precautions reviewed -- minimize activities, keep head elevated, avoid ASA/NSAIDs/blood thinners as able - f/u 4 wks -- f/u VH OD -- DFE, OCT, FA (OD)  2,3. Proliferative diabetic retinopathy w/o DME, OU  - pt of Dr. MZigmund Daniel- s/p MPC OS (10.16.17) - s/p PRP OS (11.06.17) - s/p PRP OD (11.16.17), 11.26.19) - s/p 27g PPV/EL/IVA/FAX OS (02.18.20) - s/p IVA OS x7 (last one 11.16.20) - exam shows new onset VH OD as above; OS stable with good laser in place - OCT without diabetic macular edema OS, mild cystic changes OD  4,5. Hypertensive retinopathy OU - discussed importance of tight BP control - monitor    6. Mixed Cataract OU - The symptoms of cataract, surgical options, and treatments and risks were discussed with patient. - discussed diagnosis and progression - not yet visually significant - monitor for now  Ophthalmic Meds Ordered this visit:  Meds ordered this encounter  Medications   Bevacizumab (AVASTIN) SOLN 1.25 mg       Return in about 4 weeks (around 07/11/2021[facility]  There are no Patient Instructions on file for this  visit.  Explained the diagnoses, plan, and follow up with the patient and they expressed understanding.  Patient expressed understanding of the importance of proper follow up care.   This document serves as a record of services personally performed by Gardiner Sleeper, MD, PhD. It was created on their behalf by Leonie Douglas, an ophthalmic technician. The creation of this record is the provider's dictation and/or activities during the visit.    Electronically signed by: Leonie Douglas COA, 06/14/21  12:33 AM  This document serves as a record of services personally performed by Gardiner Sleeper, MD, PhD. It was created on their behalf by San Jetty. Owens Shark, OA an ophthalmic technician. The creation of this record is the provider's dictation and/or activities during the visit.    Electronically signed by: San Jetty. Marguerita Merles 09.23.2022 12:33 AM   Gardiner Sleeper, M.D., Ph.D. Diseases & Surgery of the Retina and [place] 06/13/2021  I have reviewed the above documentation for accuracy and completeness, and I agree with the above. Gardiner Sleeper, M.D., Ph.D. 06/14/21 12:33 AM  Abbreviations: M myopia (nearsighted); A astigmatism; H hyperopia (farsighted); P presbyopia; Mrx spectacle prescription;  CTL contact lenses; OD right eye; OS left eye; OU both eyes  XT exotropia; ET esotropia; PEK punctate epithelial keratitis; PEE punctate epithelial erosions; DES dry eye syndrome; MGD meibomian gland dysfunction; ATs artificial tears; PFAT's preservative free artificial tears; Wright nuclear sclerotic cataract; PSC posterior subcapsular cataract; ERM epi-retinal membrane; PVD posterior vitreous detachment; RD retinal detachment; DM diabetes mellitus; DR diabetic retinopathy; NPDR non-proliferative diabetic retinopathy; PDR proliferative diabetic retinopathy; CSME clinically significant macular edema; DME diabetic macular edema; dbh dot blot hemorrhages; CWS cotton wool spot; POAG  primary open angle glaucoma; C/D cup-to-disc ratio; HVF humphrey visual field; GVF goldmann visual field; OCT optical coherence tomography; IOP intraocular pressure; BRVO Branch retinal vein occlusion; CRVO central retinal vein occlusion; CRAO central retinal artery occlusion; BRAO branch retinal artery occlusion; RT retinal tear; SB scleral buckle; PPV pars plana vitrectomy; VH Vitreous hemorrhage; PRP panretinal laser photocoagulation; IVK intravitreal kenalog; VMT vitreomacular traction; MH Macular hole;  NVD neovascularization of the disc; NVE neovascularization elsewhere; AREDS age related eye disease study; ARMD age related macular degeneration; POAG primary open angle glaucoma; EBMD epithelial/anterior basement membrane dystrophy; ACIOL anterior chamber intraocular lens; IOL intraocular lens; PCIOL posterior chamber intraocular lens; Phaco/IOL phacoemulsification with intraocular lens placement; [place] photorefractive keratectomy; LASIK laser assisted in situ keratomileusis; HTN hypertension; DM diabetes mellitus; COPD chronic obstructive pulmonary disease

## 2021-06-13 ENCOUNTER — Ambulatory Visit (INDEPENDENT_AMBULATORY_CARE_PROVIDER_SITE_OTHER): Payer: 59 | Admitting: Ophthalmology

## 2021-06-13 ENCOUNTER — Other Ambulatory Visit: Payer: Self-pay

## 2021-06-13 ENCOUNTER — Encounter (INDEPENDENT_AMBULATORY_CARE_PROVIDER_SITE_OTHER): Payer: Self-pay | Admitting: Ophthalmology

## 2021-06-13 DIAGNOSIS — H3581 Retinal edema: Secondary | ICD-10-CM

## 2021-06-13 DIAGNOSIS — E113513 Type 2 diabetes mellitus with proliferative diabetic retinopathy with macular edema, bilateral: Secondary | ICD-10-CM

## 2021-06-13 DIAGNOSIS — H25813 Combined forms of age-related cataract, bilateral: Secondary | ICD-10-CM

## 2021-06-13 DIAGNOSIS — I10 Essential (primary) hypertension: Secondary | ICD-10-CM

## 2021-06-13 DIAGNOSIS — E113553 Type 2 diabetes mellitus with stable proliferative diabetic retinopathy, bilateral: Secondary | ICD-10-CM

## 2021-06-13 DIAGNOSIS — H35033 Hypertensive retinopathy, bilateral: Secondary | ICD-10-CM

## 2021-06-13 DIAGNOSIS — H4311 Vitreous hemorrhage, right eye: Secondary | ICD-10-CM | POA: Diagnosis not present

## 2021-06-14 ENCOUNTER — Encounter (INDEPENDENT_AMBULATORY_CARE_PROVIDER_SITE_OTHER): Payer: Self-pay | Admitting: Ophthalmology

## 2021-06-14 MED ORDER — BEVACIZUMAB CHEMO INJECTION 1.25MG/0.05ML SYRINGE FOR KALEIDOSCOPE
1.2500 mg | INTRAVITREAL | Status: AC | PRN
  Administered 2021-06-13: 1.25 mg via INTRAVITREAL

## 2021-07-11 ENCOUNTER — Encounter (INDEPENDENT_AMBULATORY_CARE_PROVIDER_SITE_OTHER): Payer: 59 | Admitting: Ophthalmology

## 2021-07-11 DIAGNOSIS — E113513 Type 2 diabetes mellitus with proliferative diabetic retinopathy with macular edema, bilateral: Secondary | ICD-10-CM

## 2021-07-11 DIAGNOSIS — I10 Essential (primary) hypertension: Secondary | ICD-10-CM

## 2021-07-11 DIAGNOSIS — H35033 Hypertensive retinopathy, bilateral: Secondary | ICD-10-CM

## 2021-07-11 DIAGNOSIS — H3581 Retinal edema: Secondary | ICD-10-CM

## 2021-07-11 DIAGNOSIS — H25813 Combined forms of age-related cataract, bilateral: Secondary | ICD-10-CM

## 2021-07-11 DIAGNOSIS — H4311 Vitreous hemorrhage, right eye: Secondary | ICD-10-CM

## 2021-07-17 NOTE — Progress Notes (Signed)
[facility] - Clinic Note  07/25/2021     CHIEF COMPLAINT Patient presents for Retina Follow Up   HISTORY OF PRESENT ILLNESS: Randy Benson is a 50 y.o. male who presents to the clinic today for:  HPI     Retina Follow Up   Patient presents with  Other.  In both eyes.  This started 6 weeks ago.  I, the attending physician,  performed the HPI with the patient and updated documentation appropriately.        Comments   Patient here for 6 weeks retina follow up for vit hem OD PDR OU. Patient states vision going pretty good. No eye pain.       Last edited by [name], MD on 07/26/2021 12:54 AM.      Referring physician: [name], FNP [address] [place],  [place] [address]  HISTORICAL INFORMATION:   Selected notes from the MEDICAL RECORD NUMBER Dr. [name] pt here for [facility]   CURRENT MEDICATIONS: Current Outpatient Medications (Ophthalmic Drugs)  Medication Sig   bacitracin-polymyxin b (POLYSPORIN) ophthalmic ointment Place 1 application into the left eye 3 (three) times daily. apply to eye every 12 hours while awake   gatifloxacin (ZYMAXID) 0.5 % SOLN Place 1 drop into the left eye 4 (four) times daily.   prednisoLONE acetate (PRED FORTE) 1 % ophthalmic suspension Place 1 drop into the left eye 4 (four) times daily.   No current facility-administered medications for this visit. (Ophthalmic Drugs)   Current Outpatient Medications (Other)  Medication Sig   Amlodipine-Valsartan-HCTZ 10-320-25 MG TABS Take 1 tablet by mouth daily.   aspirin EC 81 MG tablet Take 81 mg by mouth daily.   atorvastatin (LIPITOR) 10 MG tablet Take 1 tablet (10 mg total) by mouth daily at 6 PM.   insulin glargine (LANTUS) 100 UNIT/ML injection Inject 10 Units into the skin daily as needed (Blood sugar over 200).    metFORMIN (GLUCOPHAGE) 1000 MG tablet Take 1,000 mg by mouth 2 (two) times daily with a meal.   No current facility-administered medications for this  visit. (Other)   REVIEW OF SYSTEMS: ROS   Positive for: Endocrine, Cardiovascular, Eyes Negative for: Constitutional, Gastrointestinal, Neurological, Skin, Genitourinary, Musculoskeletal, HENT, Respiratory, Psychiatric, Allergic/Imm, Heme/Lymph Last edited by Laddie Aquas, COA on 07/25/2021  1:04 PM.       ALLERGIES No Known Allergies  PAST MEDICAL HISTORY Past Medical History:  Diagnosis Date   Diabetes mellitus without complication (HCC)    Diabetic retinopathy (HCC)    Hypertension    Hypertensive retinopathy    Past Surgical History:  Procedure Laterality Date   GAS/FLUID EXCHANGE Left 11/08/2018   Procedure: Gas/Fluid Exchange;  Surgeon: Sherrie George, MD;  Location: [facility] OR;  Service: Ophthalmology;  Laterality: Left;   NO PAST SURGERIES     PARS PLANA VITRECTOMY 27 GAUGE Left 11/08/2018   Procedure: PARS PLANA VITRECTOMY 27 GAUGE;  Surgeon: Sherrie George, MD;  Location: [facility] OR;  Service: Ophthalmology;  Laterality: Left;    FAMILY HISTORY Family History  Problem Relation Age of Onset   Diabetes Father    Diabetes Brother    Diabetes Maternal Grandmother     SOCIAL HISTORY Social History   Tobacco Use   Smoking status: Never   Smokeless tobacco: Never  Vaping Use   Vaping Use: Never used  Substance Use Topics   Alcohol use: No   Drug use: No       OPHTHALMIC  EXAM:  Base Eye Exam     Visual Acuity (Snellen - Linear)       Right Left   Dist cc 20/100 +1 20/70 -2   Dist ph cc NI 20/40 -1    Correction: Glasses         Tonometry (Tonopen, 1:01 PM)       Right Left   Pressure 10 11         Pupils       Dark Light Shape React APD   Right 2 1 Round Minimal None   Left 2 1 Round Minimal None         Visual Fields (Counting fingers)       Left Right    Full Full         Extraocular Movement       Right Left    Full, Ortho Full, Ortho         Neuro/Psych     Oriented x3: Yes   Mood/Affect: Normal          Dilation     Both eyes: 1.0% Mydriacyl, 2.5% Phenylephrine @ 1:01 PM           Slit Lamp and Fundus Exam     Slit Lamp Exam       Right Left   Lids/Lashes Normal Normal   Conjunctiva/Sclera mild melanosis, mild temporal pinguecula mild melanosis, mild temporal pinguecula   Cornea trace Punctate epithelial erosions 1+ infeiror Punctate epithelial erosions   Anterior Chamber deep, clear, narrow temporal angle deep, clear, narrow temporal angle   Iris Round and dilated, No NVI Round and dilated, No NVI   Lens 2+ Nuclear sclerosis, 2-3+ Cortical cataract 2+ Nuclear sclerosis, 2-3+ Cortical cataract   Vitreous Vitreous syneresis, blood stained vitreous condensations clearing and settling inferiorly, prominent arc of vitreous condensations inferior macula post vitrectomy, clear         Fundus Exam       Right Left   Disc hazy view improved, 2-3+ pallor, sharp rim mild Pallor, Sharp rim   C/D Ratio 0.4 0.3   Macula hazy view improved, grossly flat, blunted foveal reflex, focal laser changes, trace cystic changes -- stably improved, scattered MA Flat, Blunted foveal reflex, scattered MA, focal laser changes   Vessels attenuated, tortuous attenuated   Periphery Attached, hazy view improving, 360 PRP visible with room for fill in far nasal and inferior periphery, pre-retinal heme settled inferiorly, scattered DBH Attached, dense 360 PRP              Refraction     Wearing Rx       Sphere Cylinder Axis   Right -1.00 +2.00 117   Left -1.50 +0.75 026            IMAGING AND PROCEDURES  Imaging and Procedures for 07/25/2021  OCT, Retina - OU - Both Eyes       Right Eye Quality was good. Central Foveal Thickness: 236. Progression has improved. Findings include abnormal foveal contour, no SRF, no IRF (Mild Interval improvement in vitreous opacities, mild diffuse atrophy, irregular foveal contour/lamination, +cystic changes--stably improved).   Left Eye Quality was good.  Central Foveal Thickness: 245. Progression has improved. Findings include normal foveal contour, no IRF, no SRF, outer retinal atrophy (Trace IRHM and cystic changes - improved).   Notes *Images captured and stored on drive  Diagnosis / Impression:  OD: Mild Interval improvement in vitreous opacities, mild diffuse atrophy, irregular foveal contour/lamination, +cystic  changes--stably improved OS: NFP; no IRF/SRF --Trace IRHM and cystic changes - improved  Clinical management:  See below  Abbreviations: NFP - Normal foveal profile. CME - cystoid macular edema. PED - pigment epithelial detachment. IRF - intraretinal fluid. SRF - subretinal fluid. EZ - ellipsoid zone. ERM - epiretinal membrane. ORA - outer retinal atrophy. ORT - outer retinal tubulation. SRHM - subretinal hyper-reflective material. IRHM - intraretinal hyper-reflective material      Fluorescein Angiography Optos (Transit OD)       Right Eye Progression has no prior data. Early phase findings include staining, window defect, vascular perfusion defect, microaneurysm. Mid/Late phase findings include staining, window defect, vascular perfusion defect, microaneurysm (No NV or leakage).   Left Eye Progression has no prior data. Early phase findings include staining, vascular perfusion defect, window defect, microaneurysm. Mid/Late phase findings include staining, window defect, microaneurysm, vascular perfusion defect (No NV).   Notes **Images stored on drive**  Impression: PDR OU - s/p heavy PRP OU No active NV OU Small patches of scattered non-perfusion Scattered MA      Intravitreal Injection, Pharmacologic Agent - OD - Right Eye       Time Out 07/25/2021. 1:47 PM. Confirmed correct patient, procedure, site, and patient consented.   Anesthesia Topical anesthesia was used. Anesthetic medications included Lidocaine 2%, Proparacaine 0.5%.   Procedure Preparation included 5% betadine to ocular surface, eyelid  speculum. A (32g) needle was used.   Injection: 1.25 mg Bevacizumab 1.25mg /0.40ml   Route: Intravitreal, Site: Right Eye   NDC: [id number], Lot: [id number], Expiration date: 09/09/2021, Waste: 0.05 mL   Post-op Post injection exam found visual acuity of at least counting fingers. The patient tolerated the procedure well. There were no complications. The patient received written and verbal post procedure care education.              ASSESSMENT/PLAN:    ICD-10-CM   1. Vitreous hemorrhage of right eye (HCC)  H43.11 Intravitreal Injection, Pharmacologic Agent - OD - Right Eye    Bevacizumab (AVASTIN) SOLN 1.25 mg    2. Proliferative diabetic retinopathy of both eyes with macular edema associated with type 2 diabetes mellitus (HCC)  B34.1937 Intravitreal Injection, Pharmacologic Agent - OD - Right Eye    Bevacizumab (AVASTIN) SOLN 1.25 mg    3. Retinal edema  H35.81 OCT, Retina - OU - Both Eyes    4. Essential hypertension  I10     5. Hypertensive retinopathy of both eyes  H35.033 Fluorescein Angiography Optos (Transit OD)    6. Combined forms of age-related cataract of both eyes  H25.813      Vitreous Hemorrhage OD - new onset VH noticed while driving to work on 90.24.09 - recently underwent diabetic medication change due to high numbers - s/p IVA OD #1 (08.26.22), #2 (09.23.22) - today, VH improving - FA (11.04.22) shows no active leakage or NV OU - BCVA 20/100 -- pt reports subjectively back to baseline  - recommend IVA OD #3 today, 11.04.22 for persistent VH - pt wishes to proceed - RBA of procedure discussed, questions answered - informed consent obtained and signed - see procedure note - VH precautions reviewed -- minimize activities, keep head elevated, avoid ASA/NSAIDs/blood thinners as able - f/u 4 wks -- f/u VH OD -- DFE, OCT  2,3. Proliferative diabetic retinopathy w/o DME, OU  - pt of Dr. [name] - s/p MPC OS (10.16.17) - s/p PRP OS (11.06.17) - s/p PRP OD  (11.16.17), 11.26.19) - s/p 27g PPV/EL/IVA/FAX  OS (02.18.20) - s/p IVA OS x7 (last one 11.16.20) - exam shows new onset VH OD as above; OS stable with good laser in place - OCT without diabetic macular edema OS, mild cystic changes OD  4,5. Hypertensive retinopathy OU - discussed importance of tight BP control - monitor   6. Mixed Cataract OU - The symptoms of cataract, surgical options, and treatments and risks were discussed with patient. - discussed diagnosis and progression - not yet visually significant - monitor for now  Ophthalmic Meds Ordered this visit:  Meds ordered this encounter  Medications   Bevacizumab (AVASTIN) SOLN 1.25 mg     Return in about 4 weeks (around 08/22/2021) for f/u PDR OU, DFE, OCT.  There are no Patient Instructions on file for this visit.  Explained the diagnoses, plan, and follow up with the patient and they expressed understanding.  Patient expressed understanding of the importance of proper follow up care.   This document serves as a record of services personally performed by Karie Chimera, MD, PhD. It was created on their behalf by Joni Reining, an ophthalmic technician. The creation of this record is the provider's dictation and/or activities during the visit.    Electronically signed by: Joni Reining COA, 07/26/21  1:11 AM  This document serves as a record of services personally performed by Karie Chimera, MD, PhD. It was created on their behalf by Glee Arvin. Manson Passey, OA an ophthalmic technician. The creation of this record is the provider's dictation and/or activities during the visit.    Electronically signed by: Glee Arvin. Manson Passey, [place] 11.04.2022 1:11 AM   Karie Chimera, M.D., Ph.D. Diseases & Surgery of the Retina and Vitreous [facility] 07/25/2021  I have reviewed the above documentation for accuracy and completeness, and I agree with the above. Karie Chimera, M.D., Ph.D. 07/26/21 1:11 AM  Abbreviations: M  myopia (nearsighted); A astigmatism; H hyperopia (farsighted); P presbyopia; Mrx spectacle prescription;  CTL contact lenses; OD right eye; OS left eye; OU both eyes  XT exotropia; ET esotropia; PEK punctate epithelial keratitis; PEE punctate epithelial erosions; DES dry eye syndrome; MGD meibomian gland dysfunction; ATs artificial tears; PFAT's preservative free artificial tears; NSC nuclear sclerotic cataract; PSC posterior subcapsular cataract; ERM epi-retinal membrane; PVD posterior vitreous detachment; RD retinal detachment; DM diabetes mellitus; DR diabetic retinopathy; NPDR non-proliferative diabetic retinopathy; PDR proliferative diabetic retinopathy; CSME clinically significant macular edema; DME diabetic macular edema; dbh dot blot hemorrhages; CWS cotton wool spot; POAG primary open angle glaucoma; C/D cup-to-disc ratio; HVF humphrey visual field; GVF goldmann visual field; OCT optical coherence tomography; IOP intraocular pressure; BRVO Branch retinal vein occlusion; CRVO central retinal vein occlusion; CRAO central retinal artery occlusion; BRAO branch retinal artery occlusion; RT retinal tear; SB scleral buckle; PPV pars plana vitrectomy; VH Vitreous hemorrhage; PRP panretinal laser photocoagulation; IVK intravitreal kenalog; VMT vitreomacular traction; MH Macular hole;  NVD neovascularization of the disc; NVE neovascularization elsewhere; AREDS age related eye disease study; ARMD age related macular degeneration; POAG primary open angle glaucoma; EBMD epithelial/anterior basement membrane dystrophy; ACIOL anterior chamber intraocular lens; IOL intraocular lens; PCIOL posterior chamber intraocular lens; Phaco/IOL phacoemulsification with intraocular lens placement; PRK photorefractive keratectomy; LASIK laser assisted in situ keratomileusis; HTN hypertension; DM diabetes mellitus; COPD chronic obstructive pulmonary disease

## 2021-07-18 ENCOUNTER — Encounter (INDEPENDENT_AMBULATORY_CARE_PROVIDER_SITE_OTHER): Payer: 59 | Admitting: Ophthalmology

## 2021-07-25 ENCOUNTER — Encounter (INDEPENDENT_AMBULATORY_CARE_PROVIDER_SITE_OTHER): Payer: Self-pay | Admitting: Ophthalmology

## 2021-07-25 ENCOUNTER — Other Ambulatory Visit: Payer: Self-pay

## 2021-07-25 ENCOUNTER — Ambulatory Visit (INDEPENDENT_AMBULATORY_CARE_PROVIDER_SITE_OTHER): Payer: 59 | Admitting: Ophthalmology

## 2021-07-25 DIAGNOSIS — E113513 Type 2 diabetes mellitus with proliferative diabetic retinopathy with macular edema, bilateral: Secondary | ICD-10-CM

## 2021-07-25 DIAGNOSIS — I10 Essential (primary) hypertension: Secondary | ICD-10-CM

## 2021-07-25 DIAGNOSIS — H35033 Hypertensive retinopathy, bilateral: Secondary | ICD-10-CM

## 2021-07-25 DIAGNOSIS — H25813 Combined forms of age-related cataract, bilateral: Secondary | ICD-10-CM

## 2021-07-25 DIAGNOSIS — H4311 Vitreous hemorrhage, right eye: Secondary | ICD-10-CM

## 2021-07-25 DIAGNOSIS — H3581 Retinal edema: Secondary | ICD-10-CM

## 2021-07-26 ENCOUNTER — Encounter (INDEPENDENT_AMBULATORY_CARE_PROVIDER_SITE_OTHER): Payer: Self-pay | Admitting: Ophthalmology

## 2021-07-26 MED ORDER — BEVACIZUMAB CHEMO INJECTION 1.25MG/0.05ML SYRINGE FOR KALEIDOSCOPE
1.2500 mg | INTRAVITREAL | Status: AC | PRN
  Administered 2021-07-25: 1.25 mg via INTRAVITREAL

## 2021-08-19 NOTE — Progress Notes (Shared)
[facility] - Clinic Note  08/22/2021     CHIEF COMPLAINT Patient presents for No chief complaint on file.   HISTORY OF PRESENT ILLNESS: Randy Benson is a 50 y.o. male who presents to the clinic today for:     Referring physician: [name], FNP [address] [place],  [place] [address]  HISTORICAL INFORMATION:   Selected notes from the MEDICAL RECORD NUMBER Dr. [name] pt here for [facility]   CURRENT MEDICATIONS: Current Outpatient Medications (Ophthalmic Drugs)  Medication Sig   bacitracin-polymyxin b (POLYSPORIN) ophthalmic ointment Place 1 application into the left eye 3 (three) times daily. apply to eye every 12 hours while awake   gatifloxacin (ZYMAXID) 0.5 % SOLN Place 1 drop into the left eye 4 (four) times daily.   prednisoLONE acetate (PRED FORTE) 1 % ophthalmic suspension Place 1 drop into the left eye 4 (four) times daily.   No current facility-administered medications for this visit. (Ophthalmic Drugs)   Current Outpatient Medications (Other)  Medication Sig   Amlodipine-Valsartan-HCTZ 10-320-25 MG TABS Take 1 tablet by mouth daily.   aspirin EC 81 MG tablet Take 81 mg by mouth daily.   atorvastatin (LIPITOR) 10 MG tablet Take 1 tablet (10 mg total) by mouth daily at 6 PM.   insulin glargine (LANTUS) 100 UNIT/ML injection Inject 10 Units into the skin daily as needed (Blood sugar over 200).    metFORMIN (GLUCOPHAGE) 1000 MG tablet Take 1,000 mg by mouth 2 (two) times daily with a meal.   No current facility-administered medications for this visit. (Other)   REVIEW OF SYSTEMS:     ALLERGIES No Known Allergies  PAST MEDICAL HISTORY Past Medical History:  Diagnosis Date   Diabetes mellitus without complication (HCC)    Diabetic retinopathy (HCC)    Hypertension    Hypertensive retinopathy    Past Surgical History:  Procedure Laterality Date   GAS/FLUID EXCHANGE Left 11/08/2018   Procedure: Gas/Fluid Exchange;  Surgeon: Sherrie George, MD;  Location: [facility] OR;  Service: Ophthalmology;  Laterality: Left;   NO PAST SURGERIES     PARS PLANA VITRECTOMY 27 GAUGE Left 11/08/2018   Procedure: PARS PLANA VITRECTOMY 27 GAUGE;  Surgeon: Sherrie George, MD;  Location: [facility] OR;  Service: Ophthalmology;  Laterality: Left;    FAMILY HISTORY Family History  Problem Relation Age of Onset   Diabetes Father    Diabetes Brother    Diabetes Maternal Grandmother     SOCIAL HISTORY Social History   Tobacco Use   Smoking status: Never   Smokeless tobacco: Never  Vaping Use   Vaping Use: Never used  Substance Use Topics   Alcohol use: No   Drug use: No       OPHTHALMIC EXAM:  Not recorded     IMAGING AND PROCEDURES  Imaging and Procedures for 08/22/2021            ASSESSMENT/PLAN:    ICD-10-CM   1. Vitreous hemorrhage of right eye (HCC)  H43.11     2. 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 Retinal edema  H35.81     4. Essential hypertension  I10     5. Hypertensive retinopathy of both eyes  H35.033     6. Combined forms of age-related cataract of both eyes  H25.813       Vitreous Hemorrhage OD - new onset VH  noticed while driving to work on 01.02.72 - recently underwent diabetic medication change due to high numbers - s/p IVA OD #1 (08.26.22), #2 (09.23.22), #3 (11.04.22) - today, VH improving - FA (11.04.22) shows no active leakage or NV OU - BCVA 20/100 -- pt reports subjectively back to baseline  - recommend IVA OD #4 today, 11.04.22 for persistent VH - pt wishes to proceed - RBA of procedure discussed, questions answered - informed consent obtained and signed - see procedure note - VH precautions reviewed -- minimize activities, keep head elevated, avoid ASA/NSAIDs/blood thinners as able - f/u 4 wks -- f/u VH OD -- DFE, OCT  2,3. Proliferative diabetic retinopathy w/o DME, OU  - pt of Dr.  [name] - s/p MPC OS (10.16.17) - s/p PRP OS (11.06.17) - s/p PRP OD (11.16.17), 11.26.19) - s/p 27g PPV/EL/IVA/FAX OS (02.18.20) - s/p IVA OS x7 (last one 11.16.20) - exam shows new onset VH OD as above; OS stable with good laser in place - OCT without diabetic macular edema OS, mild cystic changes OD  4,5. Hypertensive retinopathy OU - discussed importance of tight BP control - monitor    6. Mixed Cataract OU - The symptoms of cataract, surgical options, and treatments and risks were discussed with patient. - discussed diagnosis and progression - not yet visually significant - monitor for now  Ophthalmic Meds Ordered this visit:  No orders of the defined types were placed in this encounter.    No follow-ups on file.  There are no Patient Instructions on file for this visit.  Explained the diagnoses, plan, and follow up with the patient and they expressed understanding.  Patient expressed understanding of the importance of proper follow up care.   This document serves as a record of services personally performed by Karie Chimera, MD, PhD. It was created on their behalf by Joni Reining, an ophthalmic technician. The creation of this record is the provider's dictation and/or activities during the visit.    Electronically signed by: Joni Reining COA, 08/19/21  2:43 PM    Karie Chimera, M.D., Ph.D. Diseases & Surgery of the Retina and Vitreous [facility] 08/22/2021   Abbreviations: M myopia (nearsighted); A astigmatism; H hyperopia (farsighted); P presbyopia; Mrx spectacle prescription;  CTL contact lenses; OD right eye; OS left eye; OU both eyes  XT exotropia; ET esotropia; PEK punctate epithelial keratitis; PEE punctate epithelial erosions; DES dry eye syndrome; MGD meibomian gland dysfunction; ATs artificial tears; PFAT's preservative free artificial tears; NSC nuclear sclerotic cataract; PSC posterior subcapsular cataract; ERM epi-retinal  membrane; PVD posterior vitreous detachment; RD retinal detachment; DM diabetes mellitus; DR diabetic retinopathy; NPDR non-proliferative diabetic retinopathy; PDR proliferative diabetic retinopathy; CSME clinically significant macular edema; DME diabetic macular edema; dbh dot blot hemorrhages; CWS cotton wool spot; POAG primary open angle glaucoma; C/D cup-to-disc ratio; HVF humphrey visual field; GVF goldmann visual field; OCT optical coherence tomography; IOP intraocular pressure; BRVO Branch retinal vein occlusion; CRVO central retinal vein occlusion; CRAO central retinal artery occlusion; BRAO branch retinal artery occlusion; RT retinal tear; SB scleral buckle; PPV pars plana vitrectomy; VH Vitreous hemorrhage; PRP panretinal laser photocoagulation; IVK intravitreal kenalog; VMT vitreomacular traction; MH Macular hole;  NVD neovascularization of the disc; NVE neovascularization elsewhere; AREDS age related eye disease study; ARMD age related macular degeneration; POAG primary open angle glaucoma; EBMD epithelial/anterior basement membrane dystrophy; ACIOL anterior chamber intraocular lens; IOL intraocular lens; PCIOL posterior chamber intraocular lens; Phaco/IOL phacoemulsification with intraocular lens  placement; PRK photorefractive keratectomy; LASIK laser assisted in situ keratomileusis; HTN hypertension; DM diabetes mellitus; COPD chronic obstructive pulmonary disease

## 2021-08-22 ENCOUNTER — Encounter (INDEPENDENT_AMBULATORY_CARE_PROVIDER_SITE_OTHER): Payer: 59 | Admitting: Ophthalmology

## 2021-08-22 DIAGNOSIS — H4311 Vitreous hemorrhage, right eye: Secondary | ICD-10-CM

## 2021-08-22 DIAGNOSIS — H25813 Combined forms of age-related cataract, bilateral: Secondary | ICD-10-CM

## 2021-08-22 DIAGNOSIS — H3581 Retinal edema: Secondary | ICD-10-CM

## 2021-08-22 DIAGNOSIS — H35033 Hypertensive retinopathy, bilateral: Secondary | ICD-10-CM

## 2021-08-22 DIAGNOSIS — E113513 Type 2 diabetes mellitus with proliferative diabetic retinopathy with macular edema, bilateral: Secondary | ICD-10-CM

## 2021-08-22 DIAGNOSIS — I10 Essential (primary) hypertension: Secondary | ICD-10-CM

## 2021-09-19 NOTE — Progress Notes (Shared)
[facility] - Clinic Note  09/26/2021     CHIEF COMPLAINT Patient presents for No chief complaint on file.    HISTORY OF PRESENT ILLNESS: Randy Benson is a 50 y.o. male who presents to the clinic today for:     Referring physician: [name], FNP [address] [place],  [place] [address]  HISTORICAL INFORMATION:   Selected notes from the MEDICAL RECORD NUMBER Dr. [name] pt here for [facility]   CURRENT MEDICATIONS: Current Outpatient Medications (Ophthalmic Drugs)  Medication Sig   bacitracin-polymyxin b (POLYSPORIN) ophthalmic ointment Place 1 application into the left eye 3 (three) times daily. apply to eye every 12 hours while awake   gatifloxacin (ZYMAXID) 0.5 % SOLN Place 1 drop into the left eye 4 (four) times daily.   prednisoLONE acetate (PRED FORTE) 1 % ophthalmic suspension Place 1 drop into the left eye 4 (four) times daily.   No current facility-administered medications for this visit. (Ophthalmic Drugs)   Current Outpatient Medications (Other)  Medication Sig   Amlodipine-Valsartan-HCTZ 10-320-25 MG TABS Take 1 tablet by mouth daily.   aspirin EC 81 MG tablet Take 81 mg by mouth daily.   atorvastatin (LIPITOR) 10 MG tablet Take 1 tablet (10 mg total) by mouth daily at 6 PM.   insulin glargine (LANTUS) 100 UNIT/ML injection Inject 10 Units into the skin daily as needed (Blood sugar over 200).    metFORMIN (GLUCOPHAGE) 1000 MG tablet Take 1,000 mg by mouth 2 (two) times daily with a meal.   No current facility-administered medications for this visit. (Other)   REVIEW OF SYSTEMS:     ALLERGIES No Known Allergies  PAST MEDICAL HISTORY Past Medical History:  Diagnosis Date   Diabetes mellitus without complication (HCC)    Diabetic retinopathy (HCC)    Hypertension    Hypertensive retinopathy    Past Surgical History:  Procedure Laterality Date   GAS/FLUID EXCHANGE Left 11/08/2018   Procedure: Gas/Fluid Exchange;  Surgeon: Sherrie George, MD;  Location: [facility] OR;  Service: Ophthalmology;  Laterality: Left;   NO PAST SURGERIES     PARS PLANA VITRECTOMY 27 GAUGE Left 11/08/2018   Procedure: PARS PLANA VITRECTOMY 27 GAUGE;  Surgeon: Sherrie George, MD;  Location: [facility] OR;  Service: Ophthalmology;  Laterality: Left;    FAMILY HISTORY Family History  Problem Relation Age of Onset   Diabetes Father    Diabetes Brother    Diabetes Maternal Grandmother     SOCIAL HISTORY Social History   Tobacco Use   Smoking status: Never   Smokeless tobacco: Never  Vaping Use   Vaping Use: Never used  Substance Use Topics   Alcohol use: No   Drug use: No       OPHTHALMIC EXAM:  Not recorded     IMAGING AND PROCEDURES  Imaging and Procedures for 09/26/2021            ASSESSMENT/PLAN:    ICD-10-CM   1. Vitreous hemorrhage of right eye (HCC)  H43.11     2. 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 Retinal edema  H35.81     4. Essential hypertension  I10     5. Hypertensive retinopathy of both eyes  H35.033     6. Combined forms of age-related cataract of both eyes  H25.813       Vitreous Hemorrhage OD - new onset  VH noticed while driving to work on 40.98.11 - recently underwent diabetic medication change due to high numbers - s/p IVA OD #1 (08.26.22), #2 (09.23.22), #3 (11.04.22) - today, VH improving - FA (11.04.22) shows no active leakage or NV OU - BCVA 20/100 -- pt reports subjectively back to baseline  - recommend IVA OD #4 today, 01.06.23 for persistent VH - pt wishes to proceed - RBA of procedure discussed, questions answered - informed consent obtained and signed - see procedure note - VH precautions reviewed -- minimize activities, keep head elevated, avoid ASA/NSAIDs/blood thinners as able - f/u 4 wks -- f/u VH OD -- DFE, OCT  2,3. Proliferative diabetic retinopathy w/o DME, OU  - pt of Dr.  [name] - s/p MPC OS (10.16.17) - s/p PRP OS (11.06.17) - s/p PRP OD (11.16.17), 11.26.19) - s/p 27g PPV/EL/IVA/FAX OS (02.18.20) - s/p IVA OS x7 (last one 11.16.20) - exam shows new onset VH OD as above; OS stable with good laser in place - OCT without diabetic macular edema OS, mild cystic changes OD  4,5. Hypertensive retinopathy OU - discussed importance of tight BP control - monitor   6. Mixed Cataract OU - The symptoms of cataract, surgical options, and treatments and risks were discussed with patient. - discussed diagnosis and progression - not yet visually significant - monitor for now  Ophthalmic Meds Ordered this visit:  No orders of the defined types were placed in this encounter.    No follow-ups on file.  There are no Patient Instructions on file for this visit.  Explained the diagnoses, plan, and follow up with the patient and they expressed understanding.  Patient expressed understanding of the importance of proper follow up care.   This document serves as a record of services personally performed by Karie Chimera, MD, PhD. It was created on their behalf by Joni Reining, an ophthalmic technician. The creation of this record is the provider's dictation and/or activities during the visit.    Electronically signed by: Joni Reining COA, 09/19/21  7:49 AM   Karie Chimera, M.D., Ph.D. Diseases & Surgery of the Retina and Vitreous [facility] 09/26/2021   Abbreviations: M myopia (nearsighted); A astigmatism; H hyperopia (farsighted); P presbyopia; Mrx spectacle prescription;  CTL contact lenses; OD right eye; OS left eye; OU both eyes  XT exotropia; ET esotropia; PEK punctate epithelial keratitis; PEE punctate epithelial erosions; DES dry eye syndrome; MGD meibomian gland dysfunction; ATs artificial tears; PFAT's preservative free artificial tears; NSC nuclear sclerotic cataract; PSC posterior subcapsular cataract; ERM epi-retinal membrane;  PVD posterior vitreous detachment; RD retinal detachment; DM diabetes mellitus; DR diabetic retinopathy; NPDR non-proliferative diabetic retinopathy; PDR proliferative diabetic retinopathy; CSME clinically significant macular edema; DME diabetic macular edema; dbh dot blot hemorrhages; CWS cotton wool spot; POAG primary open angle glaucoma; C/D cup-to-disc ratio; HVF humphrey visual field; GVF goldmann visual field; OCT optical coherence tomography; IOP intraocular pressure; BRVO Branch retinal vein occlusion; CRVO central retinal vein occlusion; CRAO central retinal artery occlusion; BRAO branch retinal artery occlusion; RT retinal tear; SB scleral buckle; PPV pars plana vitrectomy; VH Vitreous hemorrhage; PRP panretinal laser photocoagulation; IVK intravitreal kenalog; VMT vitreomacular traction; MH Macular hole;  NVD neovascularization of the disc; NVE neovascularization elsewhere; AREDS age related eye disease study; ARMD age related macular degeneration; POAG primary open angle glaucoma; EBMD epithelial/anterior basement membrane dystrophy; ACIOL anterior chamber intraocular lens; IOL intraocular lens; PCIOL posterior chamber intraocular lens; Phaco/IOL phacoemulsification with intraocular lens placement;  PRK photorefractive keratectomy; LASIK laser assisted in situ keratomileusis; HTN hypertension; DM diabetes mellitus; COPD chronic obstructive pulmonary disease

## 2021-09-26 ENCOUNTER — Encounter (INDEPENDENT_AMBULATORY_CARE_PROVIDER_SITE_OTHER): Payer: 59 | Admitting: Ophthalmology

## 2021-10-29 NOTE — Progress Notes (Signed)
[facility] - Clinic Note  10/31/2021     CHIEF COMPLAINT Patient presents for Retina Follow Up    HISTORY OF PRESENT ILLNESS: Randy Benson is a 51 y.o. male who presents to the clinic today for:  HPI     Retina Follow Up   Patient presents with  Diabetic Retinopathy.  In both eyes.  Duration of 14 weeks.  Since onset it is stable.  I, the attending physician,  performed the HPI with the patient and updated documentation appropriately.        Comments   14 week follow up (should have been 4 weeks) Vitreous heme OD, PDR OU-  Doing well,  not seeing any blood or anything in vision.  Vision appears stable. BS 116 this week A1C 7      Last edited by [name], MD on 11/01/2021  2:05 AM.    Pt states vision has changed   Referring physician: [name], FNP [address] [place],  [place] [address]  HISTORICAL INFORMATION:   Selected notes from the MEDICAL RECORD NUMBER Dr. [name] pt here for [facility]   CURRENT MEDICATIONS: Current Outpatient Medications (Ophthalmic Drugs)  Medication Sig   bacitracin-polymyxin b (POLYSPORIN) ophthalmic ointment Place 1 application into the left eye 3 (three) times daily. apply to eye every 12 hours while awake (Patient not taking: Reported on 10/31/2021)   gatifloxacin (ZYMAXID) 0.5 % SOLN Place 1 drop into the left eye 4 (four) times daily. (Patient not taking: Reported on 10/31/2021)   prednisoLONE acetate (PRED FORTE) 1 % ophthalmic suspension Place 1 drop into the left eye 4 (four) times daily. (Patient not taking: Reported on 10/31/2021)   No current facility-administered medications for this visit. (Ophthalmic Drugs)   Current Outpatient Medications (Other)  Medication Sig   Amlodipine-Valsartan-HCTZ 10-320-25 MG TABS Take 1 tablet by mouth daily.   aspirin EC 81 MG tablet Take 81 mg by mouth daily.   atorvastatin (LIPITOR) 10 MG tablet Take 1 tablet (10 mg total) by mouth daily at 6 PM.   insulin glargine  (LANTUS) 100 UNIT/ML injection Inject 10 Units into the skin daily as needed (Blood sugar over 200).    metFORMIN (GLUCOPHAGE) 1000 MG tablet Take 1,000 mg by mouth 2 (two) times daily with a meal.   No current facility-administered medications for this visit. (Other)   REVIEW OF SYSTEMS: ROS   Positive for: Endocrine, Cardiovascular, Eyes Negative for: Constitutional, Gastrointestinal, Neurological, Skin, Genitourinary, Musculoskeletal, HENT, Respiratory, Psychiatric, Allergic/Imm, Heme/Lymph Last edited by Joni Reining, COA on 10/31/2021  1:24 PM.     ALLERGIES No Known Allergies  PAST MEDICAL HISTORY Past Medical History:  Diagnosis Date   Diabetes mellitus without complication (HCC)    Diabetic retinopathy (HCC)    Hypertension    Hypertensive retinopathy    Past Surgical History:  Procedure Laterality Date   GAS/FLUID EXCHANGE Left 11/08/2018   Procedure: Gas/Fluid Exchange;  Surgeon: Sherrie George, MD;  Location: [facility] OR;  Service: Ophthalmology;  Laterality: Left;   NO PAST SURGERIES     PARS PLANA VITRECTOMY 27 GAUGE Left 11/08/2018   Procedure: PARS PLANA VITRECTOMY 27 GAUGE;  Surgeon: Sherrie George, MD;  Location: [facility] OR;  Service: Ophthalmology;  Laterality: Left;    FAMILY HISTORY Family History  Problem Relation Age of Onset   Diabetes Father    Diabetes Brother    Diabetes Maternal Grandmother    SOCIAL HISTORY Social History   Tobacco Use  Smoking status: Never   Smokeless tobacco: Never  Vaping Use   Vaping Use: Never used  Substance Use Topics   Alcohol use: No   Drug use: No       OPHTHALMIC EXAM:  Base Eye Exam     Visual Acuity (Snellen - Linear)       Right Left   Dist cc 20/80 +1 20/70-   Dist ph cc 20/60- 20/40-    Correction: Glasses         Tonometry (Tonopen, 1:36 PM)       Right Left   Pressure 12 12         Pupils       Dark Light Shape React APD   Right 3 2 Round Minimal None   Left 3 2 Round Minimal None          Visual Fields (Counting fingers)       Left Right    Full Full         Extraocular Movement       Right Left    Full Full         Neuro/Psych     Oriented x3: Yes   Mood/Affect: Normal         Dilation     Both eyes: 1.0% Mydriacyl, 2.5% Phenylephrine @ 1:35 PM           Slit Lamp and Fundus Exam     Slit Lamp Exam       Right Left   Lids/Lashes Normal Normal   Conjunctiva/Sclera mild melanosis, mild temporal pinguecula mild melanosis, mild temporal pinguecula   Cornea trace Punctate epithelial erosions 2+ inferior Punctate epithelial erosions   Anterior Chamber deep, clear, narrow temporal angle deep, clear, narrow temporal angle   Iris Round and dilated, No NVI Round and dilated, No NVI   Lens 2+ Nuclear sclerosis, 2-3+ Cortical cataract 2+ Nuclear sclerosis, 2-3+ Cortical cataract   Anterior Vitreous Vitreous syneresis, blood stained vitreous condensations clearing and settling inferiorly, prominent arc of vitreous condensations inferior macula - improved post vitrectomy, clear         Fundus Exam       Right Left   Disc 2-3+ pallor, sharp rim mild Pallor, Sharp rim, peripapillary blot heme SN to disc   C/D Ratio 0.5 0.3   Macula blunted foveal reflex, RPE mottling, rare MA, no edema, focal laser changes Flat, Blunted foveal reflex, scattered MA, focal laser changes   Vessels attenuated, tortuous attenuated, Tortuous   Periphery Attached, hazy view improving, 360 PRP visible with room for fill in far nasal and inferior periphery, pre-retinal heme settled inferiorly - improved, scattered DBH Attached, dense 360 PRP           Refraction     Wearing Rx       Sphere Cylinder Axis   Right -1.00 +2.00 117   Left -1.50 +0.75 026           IMAGING AND PROCEDURES  Imaging and Procedures for 10/31/2021  OCT, Retina - OU - Both Eyes       Right Eye Quality was good. Central Foveal Thickness: 235. Progression has improved. Findings  include abnormal foveal contour, no SRF, no IRF (Mild Interval improvement in vitreous opacities, mild diffuse atrophy, irregular foveal contour/lamination, +cystic changes--stably improved).   Left Eye Quality was good. Central Foveal Thickness: 245. Progression has improved. Findings include normal foveal contour, no IRF, no SRF, outer retinal atrophy (Trace IRHM and  cystic changes).   Notes *Images captured and stored on drive  Diagnosis / Impression:  OD: Mild Interval improvement in vitreous opacities, mild diffuse atrophy, irregular foveal contour/lamination, +cystic changes--stably improved OS: NFP; no IRF/SRF --Trace IRHM and cystic changes  Clinical management:  See below  Abbreviations: NFP - Normal foveal profile. CME - cystoid macular edema. PED - pigment epithelial detachment. IRF - intraretinal fluid. SRF - subretinal fluid. EZ - ellipsoid zone. ERM - epiretinal membrane. ORA - outer retinal atrophy. ORT - outer retinal tubulation. SRHM - subretinal hyper-reflective material. IRHM - intraretinal hyper-reflective material            ASSESSMENT/PLAN:    ICD-10-CM   1. Vitreous hemorrhage of right eye (HCC)  H43.11 OCT, Retina - OU - Both Eyes    2. 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 Essential hypertension  I10     4. Hypertensive retinopathy of both eyes  H35.033     5. Combined forms of age-related cataract of both eyes  H25.813       Vitreous Hemorrhage OD - delayed f/u from 4 wks to 3+ mos - new onset VH noticed while driving to work on 28.00.34 - recently underwent diabetic medication change due to high numbers - s/p IVA OD #1 (08.26.22), #2 (09.23.22), #3 (11.04.22) - today, VH improving - FA (11.04.22) shows no active leakage or NV OU - BCVA 20/60- -- pt reports subjective improvement  - exam shows interval improvement in [facility]  despite being lost to f/u - recommend holding injection today - pt in agreement - f/u 3 months -- f/u VH OD -- DFE, OCT  2. Proliferative diabetic retinopathy w/o DME, OU  - pt of Dr. [name] - s/p MPC OS (10.16.17) - s/p PRP OS (11.06.17) - s/p PRP OD (11.16.17), 11.26.19) - s/p 27g PPV/EL/IVA/FAX OS (02.18.20) - s/p IVA OS x7 (last one 11.16.20) - exam shows new onset VH OD as above; OS stable with good laser in place - OCT without diabetic macular edema OS, mild cystic changes OD  3,4. Hypertensive retinopathy OU - discussed importance of tight BP control - monitor   5. Mixed Cataract OU - The symptoms of cataract, surgical options, and treatments and risks were discussed with patient. - discussed diagnosis and progression - monitor for now  Ophthalmic Meds Ordered this visit:  No orders of the defined types were placed in this encounter.    Return in about 3 months (around 01/28/2022) for f/u PDR OU, DFE, OCT.  There are no Patient Instructions on file for this visit.  Explained the diagnoses, plan, and follow up with the patient and they expressed understanding.  Patient expressed understanding of the importance of proper follow up care.   This document serves as a record of services personally performed by Karie Chimera, MD, PhD. It was created on their behalf by Joni Reining, an ophthalmic technician. The creation of this record is the provider's dictation and/or activities during the visit.    Electronically signed by: Joni Reining COA, 11/01/21  2:06 AM   Karie Chimera, M.D., Ph.D. Diseases & Surgery of the Retina and Vitreous [facility]  I have reviewed the above documentation for accuracy and completeness, and I agree with the above. Karie Chimera, M.D., Ph.D. 11/01/21 2:08 AM  Abbreviations: M myopia (nearsighted); A astigmatism;  H hyperopia (farsighted); P presbyopia; Mrx spectacle prescription;  CTL contact lenses; OD right  eye; OS left eye; OU both eyes  XT exotropia; ET esotropia; PEK punctate epithelial keratitis; PEE punctate epithelial erosions; DES dry eye syndrome; MGD meibomian gland dysfunction; ATs artificial tears; PFAT's preservative free artificial tears; NSC nuclear sclerotic cataract; PSC posterior subcapsular cataract; ERM epi-retinal membrane; PVD posterior vitreous detachment; RD retinal detachment; DM diabetes mellitus; DR diabetic retinopathy; NPDR non-proliferative diabetic retinopathy; PDR proliferative diabetic retinopathy; CSME clinically significant macular edema; DME diabetic macular edema; dbh dot blot hemorrhages; CWS cotton wool spot; POAG primary open angle glaucoma; C/D cup-to-disc ratio; HVF humphrey visual field; GVF goldmann visual field; OCT optical coherence tomography; IOP intraocular pressure; BRVO Branch retinal vein occlusion; CRVO central retinal vein occlusion; CRAO central retinal artery occlusion; BRAO branch retinal artery occlusion; RT retinal tear; SB scleral buckle; PPV pars plana vitrectomy; VH Vitreous hemorrhage; PRP panretinal laser photocoagulation; IVK intravitreal kenalog; VMT vitreomacular traction; MH Macular hole;  NVD neovascularization of the disc; NVE neovascularization elsewhere; AREDS age related eye disease study; ARMD age related macular degeneration; POAG primary open angle glaucoma; EBMD epithelial/anterior basement membrane dystrophy; ACIOL anterior chamber intraocular lens; IOL intraocular lens; PCIOL posterior chamber intraocular lens; Phaco/IOL phacoemulsification with intraocular lens placement; PRK photorefractive keratectomy; LASIK laser assisted in situ keratomileusis; HTN hypertension; DM diabetes mellitus; COPD chronic obstructive pulmonary disease

## 2021-10-31 ENCOUNTER — Ambulatory Visit (INDEPENDENT_AMBULATORY_CARE_PROVIDER_SITE_OTHER): Payer: 59 | Admitting: Ophthalmology

## 2021-10-31 ENCOUNTER — Other Ambulatory Visit: Payer: Self-pay

## 2021-10-31 ENCOUNTER — Encounter (INDEPENDENT_AMBULATORY_CARE_PROVIDER_SITE_OTHER): Payer: Self-pay | Admitting: Ophthalmology

## 2021-10-31 DIAGNOSIS — H35033 Hypertensive retinopathy, bilateral: Secondary | ICD-10-CM | POA: Diagnosis not present

## 2021-10-31 DIAGNOSIS — H25813 Combined forms of age-related cataract, bilateral: Secondary | ICD-10-CM

## 2021-10-31 DIAGNOSIS — E113513 Type 2 diabetes mellitus with proliferative diabetic retinopathy with macular edema, bilateral: Secondary | ICD-10-CM

## 2021-10-31 DIAGNOSIS — I10 Essential (primary) hypertension: Secondary | ICD-10-CM | POA: Diagnosis not present

## 2021-10-31 DIAGNOSIS — H4311 Vitreous hemorrhage, right eye: Secondary | ICD-10-CM

## 2021-11-01 ENCOUNTER — Encounter (INDEPENDENT_AMBULATORY_CARE_PROVIDER_SITE_OTHER): Payer: Self-pay | Admitting: Ophthalmology

## 2022-01-20 NOTE — Progress Notes (Shared)
?[facility] - Clinic Note ? ?01/30/2022 ? ?  ? ?CHIEF COMPLAINT ?Patient presents for No chief complaint on file. ? ? ? ?HISTORY OF PRESENT ILLNESS: ?Randy Benson is a 51 y.o. male who presents to the clinic today for:  ? ?Pt states vision has changed ? ? ?Referring physician: ?[name], FNP ?[address] ?[place],  [place] [address] ? ?HISTORICAL INFORMATION:  ? ?Selected notes from the MEDICAL RECORD NUMBER ?Dr. [name] pt here for [facility]  ? ?CURRENT MEDICATIONS: ?Current Outpatient Medications (Ophthalmic Drugs)  ?Medication Sig  ? bacitracin-polymyxin b (POLYSPORIN) ophthalmic ointment Place 1 application into the left eye 3 (three) times daily. apply to eye every 12 hours while awake (Patient not taking: Reported on 10/31/2021)  ? gatifloxacin (ZYMAXID) 0.5 % SOLN Place 1 drop into the left eye 4 (four) times daily. (Patient not taking: Reported on 10/31/2021)  ? prednisoLONE acetate (PRED FORTE) 1 % ophthalmic suspension Place 1 drop into the left eye 4 (four) times daily. (Patient not taking: Reported on 10/31/2021)  ? ?No current facility-administered medications for this visit. (Ophthalmic Drugs)  ? ?Current Outpatient Medications (Other)  ?Medication Sig  ? Amlodipine-Valsartan-HCTZ 10-320-25 MG TABS Take 1 tablet by mouth daily.  ? aspirin EC 81 MG tablet Take 81 mg by mouth daily.  ? atorvastatin (LIPITOR) 10 MG tablet Take 1 tablet (10 mg total) by mouth daily at 6 PM.  ? insulin glargine (LANTUS) 100 UNIT/ML injection Inject 10 Units into the skin daily as needed (Blood sugar over 200).   ? metFORMIN (GLUCOPHAGE) 1000 MG tablet Take 1,000 mg by mouth 2 (two) times daily with a meal.  ? ?No current facility-administered medications for this visit. (Other)  ? ?REVIEW OF SYSTEMS: ? ? ?ALLERGIES ?No Known Allergies ? ?PAST MEDICAL HISTORY ?Past Medical History:  ?Diagnosis Date  ? Diabetes mellitus without complication (HCC)   ? Diabetic retinopathy (HCC)   ? Hypertension   ? Hypertensive  retinopathy   ? ?Past Surgical History:  ?Procedure Laterality Date  ? GAS/FLUID EXCHANGE Left 11/08/2018  ? Procedure: Gas/Fluid Exchange;  Surgeon: Sherrie George, MD;  Location: [facility] OR;  Service: Ophthalmology;  Laterality: Left;  ? NO PAST SURGERIES    ? PARS PLANA VITRECTOMY 27 GAUGE Left 11/08/2018  ? Procedure: PARS PLANA VITRECTOMY 27 GAUGE;  Surgeon: Sherrie George, MD;  Location: [facility] OR;  Service: Ophthalmology;  Laterality: Left;  ? ? ?FAMILY HISTORY ?Family History  ?Problem Relation Age of Onset  ? Diabetes Father   ? Diabetes Brother   ? Diabetes Maternal Grandmother   ? ?SOCIAL HISTORY ?Social History  ? ?Tobacco Use  ? Smoking status: Never  ? Smokeless tobacco: Never  ?Vaping Use  ? Vaping Use: Never used  ?Substance Use Topics  ? Alcohol use: No  ? Drug use: No  ?  ? ?  ?OPHTHALMIC EXAM: ? ?Not recorded ?  ? ?IMAGING AND PROCEDURES  ?Imaging and Procedures for 01/30/2022 ? ? ?  ?  ? ?  ?ASSESSMENT/PLAN: ? ?  ICD-10-CM   ?1. Vitreous hemorrhage of right eye (HCC)  H43.11   ?  ?2. Proliferative diabetic retinopathy of both eyes with macular edema associated with type 2 diabetes mellitus (HCC)  C94.7096   ?  ?3. Essential hypertension  I10   ?  ?4. Hypertensive retinopathy of both eyes  H35.033   ?  ?5. Combined forms of age-related cataract of both eyes  H25.813   ?  ? ? ? ?  Vitreous Hemorrhage OD ?- delayed f/u from 4 wks to 3+ mos ?- new onset VH noticed while driving to work on 53.97.67 ?- recently underwent diabetic medication change due to high numbers ?- s/p IVA OD #1 (08.26.22), #2 (09.23.22), #3 (11.04.22) ?- today, VH improving ?- FA (11.04.22) shows no active leakage or NV OU ?- BCVA 20/60- -- pt reports subjective improvement  ?- exam shows interval improvement in [facility] despite being lost to follow up ?- recommend holding injection today ?- pt in agreement ?- f/u 3 months -- f/u VH OD -- DFE, OCT ? ?2. Proliferative diabetic retinopathy w/o DME, OU  ?- pt of Dr. [name] ?- s/p MPC OS  (10.16.17) ?- s/p PRP OS (11.06.17) ?- s/p PRP OD (11.16.17), 11.26.19) ?- s/p 27g PPV/EL/IVA/FAX OS (02.18.20) ?- s/p IVA OS x7 (last one 11.16.20) ?- exam shows new onset VH OD as above; OS stable with good laser in place ?- OCT without diabetic macular edema OS, mild cystic changes OD ? ?3,4. Hypertensive retinopathy OU ?- discussed importance of tight BP control ?- monitor   ? ?5. Mixed Cataract OU ?- The symptoms of cataract, surgical options, and treatments and risks were discussed with patient. ?- discussed diagnosis and progression ?- monitor for now ? ?Ophthalmic Meds Ordered this visit:  ?No orders of the defined types were placed in this encounter. ?  ? ?No follow-ups on file. ? ?There are no Patient Instructions on file for this visit. ? ?Explained the diagnoses, plan, and follow up with the patient and they expressed understanding.  Patient expressed understanding of the importance of proper follow up care.  ? ?This document serves as a record of services personally performed by Karie Chimera, MD, PhD. It was created on their behalf by Joni Reining, an ophthalmic technician. The creation of this record is the provider's dictation and/or activities during the visit.   ? ?Electronically signed by: Joni Reining COA, 01/20/22  1:57 PM ? ? ?Karie Chimera, M.D., Ph.D. ?Diseases & Surgery of the Retina and Vitreous ?[facility] ? ? ?Abbreviations: ?M myopia (nearsighted); A astigmatism; H hyperopia (farsighted); P presbyopia; Mrx spectacle prescription;  CTL contact lenses; OD right eye; OS left eye; OU both eyes  XT exotropia; ET esotropia; PEK punctate epithelial keratitis; PEE punctate epithelial erosions; DES dry eye syndrome; MGD meibomian gland dysfunction; ATs artificial tears; PFAT's preservative free artificial tears; NSC nuclear sclerotic cataract; PSC posterior subcapsular cataract; ERM epi-retinal membrane; PVD posterior vitreous detachment; RD retinal detachment; DM  diabetes mellitus; DR diabetic retinopathy; NPDR non-proliferative diabetic retinopathy; PDR proliferative diabetic retinopathy; CSME clinically significant macular edema; DME diabetic macular edema; dbh dot blot hemorrhages; CWS cotton wool spot; POAG primary open angle glaucoma; C/D cup-to-disc ratio; HVF humphrey visual field; GVF goldmann visual field; OCT optical coherence tomography; IOP intraocular pressure; BRVO Branch retinal vein occlusion; CRVO central retinal vein occlusion; CRAO central retinal artery occlusion; BRAO branch retinal artery occlusion; RT retinal tear; SB scleral buckle; PPV pars plana vitrectomy; VH Vitreous hemorrhage; PRP panretinal laser photocoagulation; IVK intravitreal kenalog; VMT vitreomacular traction; MH Macular hole;  NVD neovascularization of the disc; NVE neovascularization elsewhere; AREDS age related eye disease study; ARMD age related macular degeneration; POAG primary open angle glaucoma; EBMD epithelial/anterior basement membrane dystrophy; ACIOL anterior chamber intraocular lens; IOL intraocular lens; PCIOL posterior chamber intraocular lens; Phaco/IOL phacoemulsification with intraocular lens placement; PRK photorefractive keratectomy; LASIK laser assisted in situ keratomileusis; HTN hypertension; DM diabetes mellitus; COPD chronic obstructive  pulmonary disease  ?

## 2022-01-30 ENCOUNTER — Encounter (INDEPENDENT_AMBULATORY_CARE_PROVIDER_SITE_OTHER): Payer: 59 | Admitting: Ophthalmology

## 2022-01-30 DIAGNOSIS — I10 Essential (primary) hypertension: Secondary | ICD-10-CM

## 2022-01-30 DIAGNOSIS — E113513 Type 2 diabetes mellitus with proliferative diabetic retinopathy with macular edema, bilateral: Secondary | ICD-10-CM

## 2022-01-30 DIAGNOSIS — H35033 Hypertensive retinopathy, bilateral: Secondary | ICD-10-CM

## 2022-01-30 DIAGNOSIS — H25813 Combined forms of age-related cataract, bilateral: Secondary | ICD-10-CM

## 2022-01-30 DIAGNOSIS — H4311 Vitreous hemorrhage, right eye: Secondary | ICD-10-CM

## 2022-02-02 NOTE — Progress Notes (Shared)
?[facility] Clinic Note ? ?02/06/2022 ? ?  ? ?CHIEF COMPLAINT ?Patient presents for No chief complaint on file. ? ? ? ?HISTORY OF PRESENT ILLNESS: ?Randy Benson is a 51 y.o. male who presents to the clinic today for:  ? ?Pt states vision has changed ? ? ?Referring physician: ?Etter Sjogren, F[place] [address] ? ?HISTORICAL INFORMATION:  ? ?Selected notes from the [place] ?Dr. Zigmund Daniel pt here for [facility]  ? ?CURRENT MEDICATIONS: ?Current Outpatient Medications (Ophthalmic Drugs)  ?Medication Sig  ? bacitracin-polymyxin b (POLYSPORIN) ophthalmic ointment Place 1 application into the left eye 3 (three) times daily. apply to eye every 12 hours while awake (Patient not taking: Reported on 10/31/2021)  ? gatifloxacin (ZYMAXID) 0.5 % SOLN Place 1 drop into the left eye 4 (four) times daily. (Patient not taking: Reported on 10/31/2021)  ? prednisoLONE acetate (PRED FORTE) 1 % ophthalmic suspension Place 1 drop into the left eye 4 (four) times daily. (Patient not taking: Reported on 10/31/2021)  ? ?No current facility-administered medications for this visit. (Ophthalmic Drugs)  ? ?Current Outpatient Medications (Other)  ?Medication Sig  ? Amlodipine-Valsartan-HCTZ 10-320-25 MG TABS Take 1 tablet by mouth daily.  ? aspirin EC 81 MG tablet Take 81 mg by mouth daily.  ? atorvastatin (LIPITOR) 10 MG tablet Take 1 tablet (10 mg total) by mouth daily at 6 PM.  ? insulin glargine (LANTUS) 100 UNIT/ML injection Inject 10 Units into the skin daily as needed (Blood sugar over 200).   ? metFORMIN (GLUCOPHAGE) 1000 MG tablet Take 1,000 mg by mouth 2 (two) times daily with a meal.  ? ?No current facility-administered medications for this visit. (Other)  ? ?REVIEW OF SYSTEMS: ? ? ?ALLERGIES ?No Known Allergies ? ?PAST MEDICAL HISTORY ?Past Medical History:  ?Diagnosis Date  ? Diabetes mellitus without complication ([place])   ? Diabetic retinopathy ([place])   ? Hypertension   ? Hypertensive  retinopathy   ? ?Past Surgical History:  ?Procedure Laterality Date  ? GAS/FLUID EXCHANGE Left 11/08/2018  ? Procedure: Gas/Fluid Exchange;  Surgeon: Hayden Pedro, MD;  Location: [place];  Service: Ophthalmology;  Laterality: Left;  ? NO PAST SURGERIES    ? PARS PLANA VITRECTOMY 27 GAUGE Left 11/08/2018  ? Procedure: PARS PLANA VITRECTOMY 27 GAUGE;  Surgeon: Hayden Pedro, MD;  Location: [place];  Service: Ophthalmology;  Laterality: Left;  ? ? ?FAMILY HISTORY ?Family History  ?Problem Relation Age of Onset  ? Diabetes Father   ? Diabetes Brother   ? Diabetes Maternal Grandmother   ? ?SOCIAL HISTORY ?Social History  ? ?Tobacco Use  ? Smoking status: Never  ? Smokeless tobacco: Never  ?Vaping Use  ? Vaping Use: Never used  ?Substance Use Topics  ? Alcohol use: No  ? Drug use: No  ?  ? ?  ?OPHTHALMIC EXAM: ? ?Not recorded ?  ? ?IMAGING AND PROCEDURES  ?Imaging and Procedures for 02/06/2022 ? ? ?  ?  ? ?  ?ASSESSMENT/PLAN: ? ?  ICD-10-CM   ?1. Vitreous hemorrhage of right eye ([place])  H43.11   ?  ?2. Proliferative diabetic retinopathy of both eyes with macular edema associated with type 2 diabetes mellitus ([place])  [id number]   ?  ?3. Essential hypertension  I10   ?  ?4. Hypertensive retinopathy of both eyes  H35.033   ?  ?5. Combined forms of age-related cataract of both eyes  H25.813   ?  ? ? ? ?  Vitreous Hemorrhage OD ?- delayed f/u from 4 wks to 3+ mos ?- new onset VH noticed while driving to work on [id number] ?- recently underwent diabetic medication change due to high numbers ?- s/p IVA OD #1 (08.26.22), #2 (09.23.22), #3 (11.04.22) ?- today, VH improving ?- FA (11.04.22) shows no active leakage or NV OU ?- BCVA 20/60- -- pt reports subjective improvement  ?- exam shows interval improvement in [facility] despite being lost to f/u ?- recommend holding injection today  ?- pt in agreement ?- f/u 3 months -- f/u VH OD -- DFE, OCT ? ?2. Proliferative diabetic retinopathy w/o DME, OU  ?- pt of Dr. Zigmund Daniel ?- s/p MPC OS  (10.16.17) ?- s/p PRP OS (11.06.17) ?- s/p PRP OD (11.16.17), 11.26.19) ?- s/p 27g PPV/EL/IVA/FAX OS (02.18.20) ?- s/p IVA OS x7 (last one 11.16.20) ?- exam shows new onset VH OD as above; OS stable with good laser in place ?- OCT without diabetic macular edema OS, mild cystic changes OD ? ?3,4. Hypertensive retinopathy OU ?- discussed importance of tight BP control ?- monitor  ? ?5. Mixed Cataract OU ?- The symptoms of cataract, surgical options, and treatments and risks were discussed with patient. ?- discussed diagnosis and progression ?- monitor for now ? ?Ophthalmic Meds Ordered this visit:  ?No orders of the defined types were placed in this encounter. ?  ? ?No follow-ups on file. ? ?There are no Patient Instructions on file for this visit. ? ?Explained the diagnoses, plan, and follow up with the patient and they expressed understanding.  Patient expressed understanding of the importance of proper follow up care.  ? ?This document serves as a record of services personally performed by Gardiner Sleeper, MD, PhD. It was created on their behalf by Leonie Douglas, an ophthalmic technician. The creation of this record is the provider's dictation and/or activities during the visit.   ? ?Electronically signed by: Leonie Douglas COA, 02/02/22  3:07 PM ? ? ?Gardiner Sleeper, M.D., Ph.D. ?Diseases & Surgery of the Retina and Vitreous ?[place] ? ? ? ?Abbreviations: ?M myopia (nearsighted); A astigmatism; H hyperopia (farsighted); P presbyopia; Mrx spectacle prescription;  CTL contact lenses; OD right eye; OS left eye; OU both eyes  XT exotropia; ET esotropia; PEK punctate epithelial keratitis; PEE punctate epithelial erosions; DES dry eye syndrome; MGD meibomian gland dysfunction; ATs artificial tears; PFAT's preservative free artificial tears; [place] nuclear sclerotic cataract; PSC posterior subcapsular cataract; ERM epi-retinal membrane; PVD posterior vitreous detachment; RD retinal detachment; DM  diabetes mellitus; DR diabetic retinopathy; NPDR non-proliferative diabetic retinopathy; PDR proliferative diabetic retinopathy; CSME clinically significant macular edema; DME diabetic macular edema; dbh dot blot hemorrhages; CWS cotton wool spot; POAG primary open angle glaucoma; C/D cup-to-disc ratio; HVF humphrey visual field; GVF goldmann visual field; OCT optical coherence tomography; IOP intraocular pressure; BRVO Branch retinal vein occlusion; CRVO central retinal vein occlusion; CRAO central retinal artery occlusion; BRAO branch retinal artery occlusion; RT retinal tear; SB scleral buckle; PPV pars plana vitrectomy; VH Vitreous hemorrhage; PRP panretinal laser photocoagulation; IVK intravitreal kenalog; VMT vitreomacular traction; MH Macular hole;  NVD neovascularization of the disc; NVE neovascularization elsewhere; AREDS age related eye disease study; ARMD age related macular degeneration; POAG primary open angle glaucoma; EBMD epithelial/anterior basement membrane dystrophy; ACIOL anterior chamber intraocular lens; IOL intraocular lens; PCIOL posterior chamber intraocular lens; Phaco/IOL phacoemulsification with intraocular lens placement; [place] photorefractive keratectomy; LASIK laser assisted in situ keratomileusis; HTN hypertension; DM diabetes mellitus; COPD chronic obstructive  pulmonary disease  ?

## 2022-02-06 ENCOUNTER — Encounter (INDEPENDENT_AMBULATORY_CARE_PROVIDER_SITE_OTHER): Payer: 59 | Admitting: Ophthalmology

## 2022-02-06 DIAGNOSIS — I10 Essential (primary) hypertension: Secondary | ICD-10-CM

## 2022-02-06 DIAGNOSIS — H25813 Combined forms of age-related cataract, bilateral: Secondary | ICD-10-CM

## 2022-02-06 DIAGNOSIS — H35033 Hypertensive retinopathy, bilateral: Secondary | ICD-10-CM

## 2022-02-06 DIAGNOSIS — H4311 Vitreous hemorrhage, right eye: Secondary | ICD-10-CM

## 2022-02-06 DIAGNOSIS — E113513 Type 2 diabetes mellitus with proliferative diabetic retinopathy with macular edema, bilateral: Secondary | ICD-10-CM

## 2022-03-20 NOTE — Progress Notes (Signed)
[facility] - Clinic Note  03/25/2022     CHIEF COMPLAINT Patient presents for Retina Follow Up  HISTORY OF PRESENT ILLNESS: Randy Benson is a 51 y.o. male who presents to the clinic today for:  HPI     Retina Follow Up   Patient presents with  Diabetic Retinopathy.  In both eyes.  This started years ago.  Duration of 5 months.  Since onset it is stable.  I, the attending physician,  performed the HPI with the patient and updated documentation appropriately.        Comments   Patient denies noticing any vision changes at this time. He admits to seeing flashes and floaters in the past. His blood sugar was 117 and his A1C is upper 7's.      Last edited by [name], MD on 03/25/2022 10:49 PM.    Pt states no major changes in vision, he states his blood sugar and BP are doing well  Referring physician: [name], FNP [address] [place],  [place] [address]  HISTORICAL INFORMATION:   Selected notes from the MEDICAL RECORD NUMBER Dr. [name] pt here for [facility]   CURRENT MEDICATIONS: Current Outpatient Medications (Ophthalmic Drugs)  Medication Sig   bacitracin-polymyxin b (POLYSPORIN) ophthalmic ointment Place 1 application into the left eye 3 (three) times daily. apply to eye every 12 hours while awake   gatifloxacin (ZYMAXID) 0.5 % SOLN Place 1 drop into the left eye 4 (four) times daily.   prednisoLONE acetate (PRED FORTE) 1 % ophthalmic suspension Place 1 drop into the left eye 4 (four) times daily.   No current facility-administered medications for this visit. (Ophthalmic Drugs)   Current Outpatient Medications (Other)  Medication Sig   Amlodipine-Valsartan-HCTZ 10-320-25 MG TABS Take 1 tablet by mouth daily.   aspirin EC 81 MG tablet Take 81 mg by mouth daily.   atorvastatin (LIPITOR) 10 MG tablet Take 1 tablet (10 mg total) by mouth daily at 6 PM.   insulin glargine (LANTUS) 100 UNIT/ML injection Inject 10 Units into the skin daily as needed  (Blood sugar over 200).    metFORMIN (GLUCOPHAGE) 1000 MG tablet Take 1,000 mg by mouth 2 (two) times daily with a meal.   No current facility-administered medications for this visit. (Other)   REVIEW OF SYSTEMS: ROS   Positive for: Endocrine, Cardiovascular, Eyes Negative for: Constitutional, Gastrointestinal, Neurological, Skin, Genitourinary, Musculoskeletal, HENT, Respiratory, Psychiatric, Allergic/Imm, Heme/Lymph Last edited by Julieanne Cotton, COT on 03/25/2022  1:08 PM.     ALLERGIES No Known Allergies  PAST MEDICAL HISTORY Past Medical History:  Diagnosis Date   Diabetes mellitus without complication (HCC)    Diabetic retinopathy (HCC)    Hypertension    Hypertensive retinopathy    Past Surgical History:  Procedure Laterality Date   GAS/FLUID EXCHANGE Left 11/08/2018   Procedure: Gas/Fluid Exchange;  Surgeon: Sherrie George, MD;  Location: [facility] OR;  Service: Ophthalmology;  Laterality: Left;   NO PAST SURGERIES     PARS PLANA VITRECTOMY 27 GAUGE Left 11/08/2018   Procedure: PARS PLANA VITRECTOMY 27 GAUGE;  Surgeon: Sherrie George, MD;  Location: [facility] OR;  Service: Ophthalmology;  Laterality: Left;   FAMILY HISTORY Family History  Problem Relation Age of Onset   Diabetes Father    Diabetes Brother    Diabetes Maternal Grandmother    SOCIAL HISTORY Social History   Tobacco Use   Smoking status: Never   Smokeless tobacco: Never  Vaping  Use   Vaping Use: Never used  Substance Use Topics   Alcohol use: No   Drug use: No       OPHTHALMIC EXAM:  Base Eye Exam     Visual Acuity (Snellen - Linear)       Right Left   Dist cc 20/80 +2 20/60 +1   Dist ph cc 20/70 20/40    Correction: Glasses         Tonometry (Tonopen, 1:17 PM)       Right Left   Pressure 12 10         Pupils       Dark Light Shape React APD   Right 3 2 Round Minimal None   Left 3 2 Round Minimal None         Visual Fields       Left Right    Full Full          Extraocular Movement       Right Left    Full, Ortho Full, Ortho         Neuro/Psych     Oriented x3: Yes   Mood/Affect: Normal         Dilation     Both eyes: 1.0% Mydriacyl, 2.5% Phenylephrine @ 1:11 PM           Slit Lamp and Fundus Exam     Slit Lamp Exam       Right Left   Lids/Lashes Normal Normal   Conjunctiva/Sclera mild melanosis, mild temporal pinguecula mild melanosis, mild temporal pinguecula   Cornea trace Punctate epithelial erosions, trace tear film debris 1+Punctate epithelial erosions, dry tear film, tear film debris   Anterior Chamber deep, clear, narrow temporal angle deep, clear, narrow temporal angle   Iris Round and dilated, No NVI Round and dilated, No NVI   Lens 2+ Nuclear sclerosis, 2-3+ Cortical cataract 2+ Nuclear sclerosis, 2-3+ Cortical cataract   Anterior Vitreous Vitreous syneresis, blood stained vitreous condensations clearing and settling inferiorly, prominent arc of vitreous condensations inferior macula - improved post vitrectomy, clear         Fundus Exam       Right Left   Disc 2-3+ pallor, sharp rim, focal PPP ST rim mild Pallor, Sharp rim, peripapillary blot heme SN to disc - improved   C/D Ratio 0.5 0.3   Macula blunted foveal reflex, RPE mottling, rare MA, no edema, focal laser changes Flat, Blunted foveal reflex, scattered MA, focal laser changes   Vessels attenuated, tortuous attenuated, Tortuous   Periphery Attached, 360 PRP visible with room for fill in far nasal and inferior periphery, pre-retinal heme settled inferiorly - improved, scattered DBH Attached, dense 360 PRP           Refraction     Wearing Rx       Sphere Cylinder Axis   Right -1.00 +2.00 117   Left -1.50 +0.75 026           IMAGING AND PROCEDURES  Imaging and Procedures for 03/25/2022  OCT, Retina - OU - Both Eyes       Right Eye Quality was good. Central Foveal Thickness: 233. Progression has improved. Findings include no IRF, no  SRF, abnormal foveal contour (Mild Interval improvement in vitreous opacities, mild diffuse atrophy, irregular foveal contour/lamination, +trace cystic changes -- stably improved).   Left Eye Quality was good. Central Foveal Thickness: 239. Progression has been stable. Findings include normal foveal contour, no IRF, no SRF,  outer retinal atrophy (Trace IRHM ).   Notes *Images captured and stored on drive  Diagnosis / Impression:  OD: Mild Interval improvement in vitreous opacities, mild diffuse atrophy, irregular foveal contour/lamination, +trace cystic changes -- stably improved OS: NFP; no IRF/SRF --Trace IRHM   Clinical management:  See below  Abbreviations: NFP - Normal foveal profile. CME - cystoid macular edema. PED - pigment epithelial detachment. IRF - intraretinal fluid. SRF - subretinal fluid. EZ - ellipsoid zone. ERM - epiretinal membrane. ORA - outer retinal atrophy. ORT - outer retinal tubulation. SRHM - subretinal hyper-reflective material. IRHM - intraretinal hyper-reflective material            ASSESSMENT/PLAN:    ICD-10-CM   1. Vitreous hemorrhage of right eye (HCC)  H43.11 OCT, Retina - OU - Both Eyes    2. Proliferative diabetic retinopathy of both eyes with macular edema associated with type 2 diabetes mellitus (HCC)  W97.9480     3. Essential hypertension  I10     4. Hypertensive retinopathy of both eyes  H35.033     5. Combined forms of age-related cataract of both eyes  H25.813      Vitreous Hemorrhage OD - delayed f/u from 3 mos to 5 mos - new onset VH noticed while driving to work on 16.55.37 - had recently undergone diabetic medication change due to high numbers - s/p IVA OD #1 (08.26.22), #2 (09.23.22), #3 (11.04.22) - today, VH stably improved - FA (11.04.22) shows no active leakage or NV OU - BCVA OD 20/70 (decreased)  - exam shows interval improvement in VH despite being lost to f/u - recommend holding injection today - pt in agreement -  f/u 3 months -- DFE, OCT  2. Proliferative diabetic retinopathy w/o DME, OU  - pt of Dr. [name] - s/p MPC OS (10.16.17) - s/p PRP OS (11.06.17) - s/p PRP OD (11.16.17), 11.26.19) - s/p 27g PPV/EL/IVA/FAX OS (02.18.20) - s/p IVA OS x7 (last one 11.16.20) - exam shows improved VH OD as above; OS stable with good laser in place - OCT without diabetic macular edema OS, mild cystic changes OD  3,4. Hypertensive retinopathy OU - discussed importance of tight BP control - monitor   5. Mixed Cataract OU - The symptoms of cataract, surgical options, and treatments and risks were discussed with patient. - discussed diagnosis and progression - monitor for now  Ophthalmic Meds Ordered this visit:  No orders of the defined types were placed in this encounter.    Return in about 3 months (around 06/25/2022) for f/u PDR OU, DFE, OCT.  There are no Patient Instructions on file for this visit.  Explained the diagnoses, plan, and follow up with the patient and they expressed understanding.  Patient expressed understanding of the importance of proper follow up care.   This document serves as a record of services personally performed by Karie Chimera, MD, PhD. It was created on their behalf by De Blanch, an ophthalmic technician. The creation of this record is the provider's dictation and/or activities during the visit.    Electronically signed by: De Blanch, OA, 03/25/22  10:49 PM  This document serves as a record of services personally performed by Karie Chimera, MD, PhD. It was created on their behalf by Glee Arvin. Manson Passey, OA an ophthalmic technician. The creation of this record is the provider's dictation and/or activities during the visit.    Electronically signed by: Glee Arvin. [place], [place] 07.05.2023 10:49 PM  Isaias Cowman.  Vanessa Barbara, M.D., Ph.D. Diseases & Surgery of the Retina and Vitreous [facility]  I have reviewed the above documentation for accuracy  and completeness, and I agree with the above. Karie Chimera, M.D., Ph.D. 03/25/22 11:08 PM   Abbreviations: M myopia (nearsighted); A astigmatism; H hyperopia (farsighted); P presbyopia; Mrx spectacle prescription;  CTL contact lenses; OD right eye; OS left eye; OU both eyes  XT exotropia; ET esotropia; PEK punctate epithelial keratitis; PEE punctate epithelial erosions; DES dry eye syndrome; MGD meibomian gland dysfunction; ATs artificial tears; PFAT's preservative free artificial tears; NSC nuclear sclerotic cataract; PSC posterior subcapsular cataract; ERM epi-retinal membrane; PVD posterior vitreous detachment; RD retinal detachment; DM diabetes mellitus; DR diabetic retinopathy; NPDR non-proliferative diabetic retinopathy; PDR proliferative diabetic retinopathy; CSME clinically significant macular edema; DME diabetic macular edema; dbh dot blot hemorrhages; CWS cotton wool spot; POAG primary open angle glaucoma; C/D cup-to-disc ratio; HVF humphrey visual field; GVF goldmann visual field; OCT optical coherence tomography; IOP intraocular pressure; BRVO Branch retinal vein occlusion; CRVO central retinal vein occlusion; CRAO central retinal artery occlusion; BRAO branch retinal artery occlusion; RT retinal tear; SB scleral buckle; PPV pars plana vitrectomy; VH Vitreous hemorrhage; PRP panretinal laser photocoagulation; IVK intravitreal kenalog; VMT vitreomacular traction; MH Macular hole;  NVD neovascularization of the disc; NVE neovascularization elsewhere; AREDS age related eye disease study; ARMD age related macular degeneration; POAG primary open angle glaucoma; EBMD epithelial/anterior basement membrane dystrophy; ACIOL anterior chamber intraocular lens; IOL intraocular lens; PCIOL posterior chamber intraocular lens; Phaco/IOL phacoemulsification with intraocular lens placement; PRK photorefractive keratectomy; LASIK laser assisted in situ keratomileusis; HTN hypertension; DM diabetes mellitus; COPD  chronic obstructive pulmonary disease

## 2022-03-25 ENCOUNTER — Ambulatory Visit (INDEPENDENT_AMBULATORY_CARE_PROVIDER_SITE_OTHER): Payer: 59 | Admitting: Ophthalmology

## 2022-03-25 ENCOUNTER — Encounter (INDEPENDENT_AMBULATORY_CARE_PROVIDER_SITE_OTHER): Payer: Self-pay | Admitting: Ophthalmology

## 2022-03-25 DIAGNOSIS — E113513 Type 2 diabetes mellitus with proliferative diabetic retinopathy with macular edema, bilateral: Secondary | ICD-10-CM | POA: Diagnosis not present

## 2022-03-25 DIAGNOSIS — H4311 Vitreous hemorrhage, right eye: Secondary | ICD-10-CM | POA: Diagnosis not present

## 2022-03-25 DIAGNOSIS — I10 Essential (primary) hypertension: Secondary | ICD-10-CM

## 2022-03-25 DIAGNOSIS — H35033 Hypertensive retinopathy, bilateral: Secondary | ICD-10-CM | POA: Diagnosis not present

## 2022-03-25 DIAGNOSIS — H25813 Combined forms of age-related cataract, bilateral: Secondary | ICD-10-CM

## 2022-06-26 ENCOUNTER — Encounter (INDEPENDENT_AMBULATORY_CARE_PROVIDER_SITE_OTHER): Payer: 59 | Admitting: Ophthalmology

## 2022-06-26 DIAGNOSIS — H35033 Hypertensive retinopathy, bilateral: Secondary | ICD-10-CM

## 2022-06-26 DIAGNOSIS — E113513 Type 2 diabetes mellitus with proliferative diabetic retinopathy with macular edema, bilateral: Secondary | ICD-10-CM

## 2022-06-26 DIAGNOSIS — I10 Essential (primary) hypertension: Secondary | ICD-10-CM

## 2022-06-26 DIAGNOSIS — H4311 Vitreous hemorrhage, right eye: Secondary | ICD-10-CM

## 2022-06-26 DIAGNOSIS — H25813 Combined forms of age-related cataract, bilateral: Secondary | ICD-10-CM

## 2022-08-25 NOTE — Progress Notes (Shared)
[facility] - Clinic Note  08/31/2022     CHIEF COMPLAINT Patient presents for No chief complaint on file.  HISTORY OF PRESENT ILLNESS: Randy Benson is a 51 y.o. male who presents to the clinic today for:   Pt states no major changes in vision, he states his blood sugar and BP are doing well  Referring physician: [name], FNP [address] [place],  [place] [address]  HISTORICAL INFORMATION:   Selected notes from the MEDICAL RECORD NUMBER Dr. [name] pt here for [facility]   CURRENT MEDICATIONS: Current Outpatient Medications (Ophthalmic Drugs)  Medication Sig   bacitracin-polymyxin b (POLYSPORIN) ophthalmic ointment Place 1 application into the left eye 3 (three) times daily. apply to eye every 12 hours while awake   gatifloxacin (ZYMAXID) 0.5 % SOLN Place 1 drop into the left eye 4 (four) times daily.   prednisoLONE acetate (PRED FORTE) 1 % ophthalmic suspension Place 1 drop into the left eye 4 (four) times daily.   No current facility-administered medications for this visit. (Ophthalmic Drugs)   Current Outpatient Medications (Other)  Medication Sig   Amlodipine-Valsartan-HCTZ 10-320-25 MG TABS Take 1 tablet by mouth daily.   aspirin EC 81 MG tablet Take 81 mg by mouth daily.   atorvastatin (LIPITOR) 10 MG tablet Take 1 tablet (10 mg total) by mouth daily at 6 PM.   insulin glargine (LANTUS) 100 UNIT/ML injection Inject 10 Units into the skin daily as needed (Blood sugar over 200).    metFORMIN (GLUCOPHAGE) 1000 MG tablet Take 1,000 mg by mouth 2 (two) times daily with a meal.   No current facility-administered medications for this visit. (Other)   REVIEW OF SYSTEMS:   ALLERGIES No Known Allergies  PAST MEDICAL HISTORY Past Medical History:  Diagnosis Date   Diabetes mellitus without complication (HCC)    Diabetic retinopathy (HCC)    Hypertension    Hypertensive retinopathy    Past Surgical History:  Procedure Laterality Date   GAS/FLUID  EXCHANGE Left 11/08/2018   Procedure: Gas/Fluid Exchange;  Surgeon: Sherrie George, MD;  Location: [facility] OR;  Service: Ophthalmology;  Laterality: Left;   NO PAST SURGERIES     PARS PLANA VITRECTOMY 27 GAUGE Left 11/08/2018   Procedure: PARS PLANA VITRECTOMY 27 GAUGE;  Surgeon: Sherrie George, MD;  Location: [facility] OR;  Service: Ophthalmology;  Laterality: Left;   FAMILY HISTORY Family History  Problem Relation Age of Onset   Diabetes Father    Diabetes Brother    Diabetes Maternal Grandmother    SOCIAL HISTORY Social History   Tobacco Use   Smoking status: Never   Smokeless tobacco: Never  Vaping Use   Vaping Use: Never used  Substance Use Topics   Alcohol use: No   Drug use: No       OPHTHALMIC EXAM:  Not recorded    IMAGING AND PROCEDURES  Imaging and Procedures for 08/31/2022          ASSESSMENT/PLAN:    ICD-10-CM   1. Vitreous hemorrhage of right eye (HCC)  H43.11     2. Proliferative diabetic retinopathy of both eyes with macular edema associated with type 2 diabetes mellitus (HCC)  Y60.6301     3. Essential hypertension  I10     4. Hypertensive retinopathy of both eyes  H35.033     5. Combined forms of age-related cataract of both eyes  H25.813       Vitreous Hemorrhage OD - delayed f/u from 3  mos to 5 mos - new onset VH noticed while driving to work on 35.00.93 - had recently undergone diabetic medication change due to high numbers - s/p IVA OD #1 (08.26.22), #2 (09.23.22), #3 (11.04.22) - today, VH stably improved - FA (11.04.22) shows no active leakage or NV OU - BCVA OD 20/70 (decreased)  - exam shows interval improvement in VH despite being lost to f/u - recommend holding injection today - pt in agreement - f/u 3 months -- DFE, OCT  2. Proliferative diabetic retinopathy w/o DME, OU  - pt of Dr. [name] - s/p MPC OS (10.16.17) - s/p PRP OS (11.06.17) - s/p PRP OD (11.16.17), 11.26.19) - s/p 27g PPV/EL/IVA/FAX OS (02.18.20) - s/p IVA  OS x7 (last one 11.16.20) - exam shows improved VH OD as above; OS stable with good laser in place - OCT with mild diabetic macular edema OS, OD with mild cystic changes  3,4. Hypertensive retinopathy OU - discussed importance of tight BP control - monitor  5. Mixed Cataract OU - The symptoms of cataract, surgical options, and treatments and risks were discussed with patient. - discussed diagnosis and progression - monitor for now  Ophthalmic Meds Ordered this visit:  No orders of the defined types were placed in this encounter.    No follow-ups on file.  There are no Patient Instructions on file for this visit.  Explained the diagnoses, plan, and follow up with the patient and they expressed understanding.  Patient expressed understanding of the importance of proper follow up care.   This document serves as a record of services personally performed by Karie Chimera, MD, PhD. It was created on their behalf by Annalee Genta, COMT. The creation of this record is the provider's dictation and/or activities during the visit.  Electronically signed by: Annalee Genta, COMT 08/25/22 3:02 PM    Karie Chimera, M.D., Ph.D. Diseases & Surgery of the Retina and Vitreous [facility]     Abbreviations: M myopia (nearsighted); A astigmatism; H hyperopia (farsighted); P presbyopia; Mrx spectacle prescription;  CTL contact lenses; OD right eye; OS left eye; OU both eyes  XT exotropia; ET esotropia; PEK punctate epithelial keratitis; PEE punctate epithelial erosions; DES dry eye syndrome; MGD meibomian gland dysfunction; ATs artificial tears; PFAT's preservative free artificial tears; NSC nuclear sclerotic cataract; PSC posterior subcapsular cataract; ERM epi-retinal membrane; PVD posterior vitreous detachment; RD retinal detachment; DM diabetes mellitus; DR diabetic retinopathy; NPDR non-proliferative diabetic retinopathy; PDR proliferative diabetic retinopathy; CSME  clinically significant macular edema; DME diabetic macular edema; dbh dot blot hemorrhages; CWS cotton wool spot; POAG primary open angle glaucoma; C/D cup-to-disc ratio; HVF humphrey visual field; GVF goldmann visual field; OCT optical coherence tomography; IOP intraocular pressure; BRVO Branch retinal vein occlusion; CRVO central retinal vein occlusion; CRAO central retinal artery occlusion; BRAO branch retinal artery occlusion; RT retinal tear; SB scleral buckle; PPV pars plana vitrectomy; VH Vitreous hemorrhage; PRP panretinal laser photocoagulation; IVK intravitreal kenalog; VMT vitreomacular traction; MH Macular hole;  NVD neovascularization of the disc; NVE neovascularization elsewhere; AREDS age related eye disease study; ARMD age related macular degeneration; POAG primary open angle glaucoma; EBMD epithelial/anterior basement membrane dystrophy; ACIOL anterior chamber intraocular lens; IOL intraocular lens; PCIOL posterior chamber intraocular lens; Phaco/IOL phacoemulsification with intraocular lens placement; PRK photorefractive keratectomy; LASIK laser assisted in situ keratomileusis; HTN hypertension; DM diabetes mellitus; COPD chronic obstructive pulmonary disease

## 2022-08-31 ENCOUNTER — Encounter (INDEPENDENT_AMBULATORY_CARE_PROVIDER_SITE_OTHER): Payer: 59 | Admitting: Ophthalmology

## 2022-08-31 DIAGNOSIS — E113513 Type 2 diabetes mellitus with proliferative diabetic retinopathy with macular edema, bilateral: Secondary | ICD-10-CM

## 2022-08-31 DIAGNOSIS — H35033 Hypertensive retinopathy, bilateral: Secondary | ICD-10-CM

## 2022-08-31 DIAGNOSIS — H25813 Combined forms of age-related cataract, bilateral: Secondary | ICD-10-CM

## 2022-08-31 DIAGNOSIS — I10 Essential (primary) hypertension: Secondary | ICD-10-CM

## 2022-08-31 DIAGNOSIS — H4311 Vitreous hemorrhage, right eye: Secondary | ICD-10-CM

## 2023-12-08 NOTE — Progress Notes (Signed)
 [facility] - Clinic Note  12/10/2023     CHIEF COMPLAINT Patient presents for Retina Evaluation  HISTORY OF PRESENT ILLNESS: Randy Benson is a 53 y.o. male who presents to the clinic today for:  HPI     Retina Evaluation   In right eye.  This started 2 years ago.  Duration of 1 week.  Associated Symptoms Negative for Flashes, Floaters, Distortion, Blind Spot, Pain, Redness, Photophobia, Glare, Trauma, Scalp Tenderness, Jaw Claudication, Shoulder/Hip pain, Fever, Weight Loss and Fatigue.  Treatments tried include no treatments.  I, the attending physician,  performed the HPI with the patient and updated documentation appropriately.        Comments   Retina eval per Dr.Harmon. Patient saw Dr.Harmon for normal eye exam and was told he had fluid in the right eye. Patient states vision has been good. Blood sugar 93 A1c 5.8      Last edited by [name], MD on 12/12/2023  1:09 AM.    Pt is delayed to follow up from 3 months to 20 months (07.05.23 to 03.21.25), pt states he saw Dr. Orson Slick for a routine eye exam and was told he has fluid in his right eye  Referring physician: [name], FNP [address] [place],  [place] [address]  HISTORICAL INFORMATION:   Selected notes from the MEDICAL RECORD NUMBER Dr. [name] pt here for [facility]   CURRENT MEDICATIONS: Current Outpatient Medications (Ophthalmic Drugs)  Medication Sig   bacitracin-polymyxin b (POLYSPORIN) ophthalmic ointment Place 1 application into the left eye 3 (three) times daily. apply to eye every 12 hours while awake (Patient not taking: Reported on 12/10/2023)   gatifloxacin (ZYMAXID) 0.5 % SOLN Place 1 drop into the left eye 4 (four) times daily. (Patient not taking: Reported on 12/10/2023)   prednisoLONE acetate (PRED FORTE) 1 % ophthalmic suspension Place 1 drop into the left eye 4 (four) times daily. (Patient not taking: Reported on 12/10/2023)   No current facility-administered medications for  this visit. (Ophthalmic Drugs)   Current Outpatient Medications (Other)  Medication Sig   Amlodipine-Valsartan-HCTZ 10-320-25 MG TABS Take 1 tablet by mouth daily.   aspirin EC 81 MG tablet Take 81 mg by mouth daily.   atorvastatin (LIPITOR) 10 MG tablet Take 1 tablet (10 mg total) by mouth daily at 6 PM.   tirzepatide (MOUNJARO) 7.5 MG/0.5ML Pen Inject 7.5 mg into the skin once a week.   insulin glargine (LANTUS) 100 UNIT/ML injection Inject 10 Units into the skin daily as needed (Blood sugar over 200).  (Patient not taking: Reported on 12/10/2023)   metFORMIN (GLUCOPHAGE) 1000 MG tablet Take 1,000 mg by mouth 2 (two) times daily with a meal. (Patient not taking: Reported on 12/10/2023)   No current facility-administered medications for this visit. (Other)   REVIEW OF SYSTEMS: ROS   Positive for: Endocrine, Cardiovascular, Eyes Negative for: Constitutional, Gastrointestinal, Neurological, Skin, Genitourinary, Musculoskeletal, HENT, Respiratory, Psychiatric, Allergic/Imm, Heme/Lymph Last edited by Lana Fish, COT on 12/10/2023  9:05 AM.      ALLERGIES No Known Allergies  PAST MEDICAL HISTORY Past Medical History:  Diagnosis Date   Diabetes mellitus without complication (HCC)    Diabetic retinopathy (HCC)    Hypertension    Hypertensive retinopathy    Past Surgical History:  Procedure Laterality Date   GAS/FLUID EXCHANGE Left 11/08/2018   Procedure: Gas/Fluid Exchange;  Surgeon: Sherrie George, MD;  [facility] OR;  Service: Ophthalmology;  Laterality: Left;   NO  PAST SURGERIES     PARS PLANA VITRECTOMY 27 GAUGE Left 11/08/2018   Procedure: PARS PLANA VITRECTOMY 27 GAUGE;  Surgeon: Sherrie George, MD;  Location: [facility] OR;  Service: Ophthalmology;  Laterality: Left;   FAMILY HISTORY Family History  Problem Relation Age of Onset   Diabetes Father    Diabetes Brother    Diabetes Maternal Grandmother    SOCIAL HISTORY Social History   Tobacco Use   Smoking status:  Never   Smokeless tobacco: Never  Vaping Use   Vaping status: Never Used  Substance Use Topics   Alcohol use: No   Drug use: No       OPHTHALMIC EXAM:  Base Eye Exam     Visual Acuity (Snellen - Linear)       Right Left   Dist cc 2060 +1 20/30 -3   Dist ph cc 20/50 -3 20/30 -2    Correction: Glasses         Tonometry (Tonopen, 9:26 AM)       Right Left   Pressure 18 16         Pupils       Dark Light Shape React APD   Right 3 2 Round Minimal None   Left 3 2 Irregular Minimal None         Visual Fields (Counting fingers)       Left Right    Full Full         Extraocular Movement       Right Left    Full, Ortho Full, Ortho         Neuro/Psych     Oriented x3: Yes   Mood/Affect: Normal         Dilation     Both eyes: 1.0% Mydriacyl, 2.5% Phenylephrine @ 9:26 AM           Slit Lamp and Fundus Exam     Slit Lamp Exam       Right Left   Lids/Lashes Dermatochalasis - upper lid Dermatochalasis - upper lid   Conjunctiva/Sclera mild melanosis, mild temporal pinguecula mild melanosis, mild temporal pinguecula   Cornea trace Punctate epithelial erosions 1+ fine Punctate epithelial erosions inferiorly   Anterior Chamber deep, clear, narrow temporal angle deep, clear, narrow temporal angle   Iris Round and dilated, No NVI Round and dilated, No NVI   Lens 2+ Nuclear sclerosis with mild brunescene, 2-3+ Cortical cataract 2+ Nuclear sclerosis with mild brunescene, 2-3+ Cortical cataract   Anterior Vitreous mild syneresis, blood stained vitreous condensations cleared post vitrectomy, clear         Fundus Exam       Right Left   Disc 2-3+ pallor, sharp rim, focal PPP ST rim, +cupping, thin superior mild Pallor, Sharp rim, no heme   C/D Ratio 0.7 0.4   Macula blunted foveal reflex, RPE mottling, rare MA, no edema, focal laser changes Flat, Blunted foveal reflex, scattered MA, focal laser changes   Vessels attenuated, tortuous attenuated,  Tortuous   Periphery Attached, 360 PRP visible with room for fill in far nasal and inferior periphery, pre-retinal heme settled inferiorly - improved, scattered DBH Attached, dense 360 PRP           Refraction     Wearing Rx       Sphere Cylinder Axis   Right -3.00 +3.25 124   Left -5.50 +3.50 057         Manifest Refraction (Auto)  Sphere Cylinder Axis Dist VA   Right -4.50 +3.00 087 20/NI   Left -5.75 +3.00 061 20/40-2           IMAGING AND PROCEDURES  Imaging and Procedures for 12/10/2023  OCT, Retina - OU - Both Eyes       Right Eye Quality was good. Central Foveal Thickness: 228. Progression has improved. Findings include no IRF, no SRF, abnormal foveal contour, inner retinal atrophy (Interval improvement in vitreous opacities, diffuse inner retinal atrophy with irregular foveal contour/lamination, no fluid).   Left Eye Quality was good. Central Foveal Thickness: 243. Progression has been stable. Findings include normal foveal contour, no IRF, no SRF, outer retinal atrophy (Trace cystic changes IT fove and mac).   Notes *Images captured and stored on drive  Diagnosis / Impression:  OD: Interval improvement in vitreous opacities, diffuse inner retinal atrophy with irregular foveal contour/lamination, no fluid OS: Trace cystic changes IT fovea and mac  Clinical management:  See below  Abbreviations: NFP - Normal foveal profile. CME - cystoid macular edema. PED - pigment epithelial detachment. IRF - intraretinal fluid. SRF - subretinal fluid. EZ - ellipsoid zone. ERM - epiretinal membrane. ORA - outer retinal atrophy. ORT - outer retinal tubulation. SRHM - subretinal hyper-reflective material. IRHM - intraretinal hyper-reflective material            ASSESSMENT/PLAN:    ICD-10-CM   1. Vitreous hemorrhage of right eye (HCC)  H43.11 OCT, Retina - OU - Both Eyes    2. Proliferative diabetic retinopathy of both eyes with macular edema associated with  type 2 diabetes mellitus (HCC)  E11.3513 OCT, Retina - OU - Both Eyes    3. Essential hypertension  I10     4. Hypertensive retinopathy of both eyes  H35.033     5. Combined forms of age-related cataract of both eyes  H25.813      Vitreous Hemorrhage OD  - delayed 3 mos to 20 mos (07.05.24 - 03.21.25) - delayed f/u from 3 mos to 5 mos - onset VH noticed while driving to work on 16.10.96 - had recently undergone diabetic medication change due to high numbers - s/p IVA OD #1 (08.26.22), #2 (09.23.22), #3 (11.04.22) - today, VH stably improved - FA (11.04.22) shows no active leakage or NV OU - BCVA OD 20/50 (improved from 20/70)  - exam shows interval improvement in [facility] despite being lost to f/u - recommend holding injection today - pt in agreement - f/u 3-4 months -- DFE, OCT  2. Proliferative diabetic retinopathy w/o DME, OU  - pt of Dr. [name] - s/p MPC OS (10.16.17) - s/p PRP OS (11.06.17) - s/p PRP OD (11.16.17), 11.26.19) - s/p 27g PPV/EL/IVA/FAX OS (02.18.20) - s/p IVA OS x7 (last one 11.16.20) - exam shows improved VH OD as above; OS stable with good laser in place - OCT without diabetic macular edema OS, mild cystic changes OD - monitor - f/u in 3-4 mos  3,4. Hypertensive retinopathy OU - discussed importance of tight BP control - monitor   5. Mixed Cataract OU - The symptoms of cataract, surgical options, and treatments and risks were discussed with patient. - discussed diagnosis and progression - monitor for now  Ophthalmic Meds Ordered this visit:  No orders of the defined types were placed in this encounter.    Return for f/u 3-4 months, PDR OU, DFE, OCT.  There are no Patient Instructions on file for this visit.  This document serves as a record  of services personally performed by Karie Chimera, MD, PhD. It was created on their behalf by Berlin Hun COT, an ophthalmic technician. The creation of this record is the provider's dictation and/or  activities during the visit.    Electronically signed by: Berlin Hun COT 03.19.25  1:13 AM  This document serves as a record of services personally performed by Karie Chimera, MD, PhD. It was created on their behalf by Glee Arvin. Manson Passey, OA an ophthalmic technician. The creation of this record is the provider's dictation and/or activities during the visit.    Electronically signed by: Glee Arvin. Manson Passey, OA 12/12/23 1:13 AM  Karie Chimera, M.D., Ph.D. Diseases & Surgery of the Retina and Vitreous [facility] 12/10/2023   I have reviewed the above documentation for accuracy and completeness, and I agree with the above. Karie Chimera, M.D., Ph.D. 12/12/23 1:14 AM    Abbreviations: M myopia (nearsighted); A astigmatism; H hyperopia (farsighted); P presbyopia; Mrx spectacle prescription;  CTL contact lenses; OD right eye; OS left eye; OU both eyes  XT exotropia; ET esotropia; PEK punctate epithelial keratitis; PEE punctate epithelial erosions; DES dry eye syndrome; MGD meibomian gland dysfunction; ATs artificial tears; PFAT's preservative free artificial tears; NSC nuclear sclerotic cataract; PSC posterior subcapsular cataract; ERM epi-retinal membrane; PVD posterior vitreous detachment; RD retinal detachment; DM diabetes mellitus; DR diabetic retinopathy; NPDR non-proliferative diabetic retinopathy; PDR proliferative diabetic retinopathy; CSME clinically significant macular edema; DME diabetic macular edema; dbh dot blot hemorrhages; CWS cotton wool spot; POAG primary open angle glaucoma; C/D cup-to-disc ratio; HVF humphrey visual field; GVF goldmann visual field; OCT optical coherence tomography; IOP intraocular pressure; BRVO Branch retinal vein occlusion; CRVO central retinal vein occlusion; CRAO central retinal artery occlusion; BRAO branch retinal artery occlusion; RT retinal tear; SB scleral buckle; PPV pars plana vitrectomy; VH Vitreous hemorrhage; PRP panretinal  laser photocoagulation; IVK intravitreal kenalog; VMT vitreomacular traction; MH Macular hole;  NVD neovascularization of the disc; NVE neovascularization elsewhere; AREDS age related eye disease study; ARMD age related macular degeneration; POAG primary open angle glaucoma; EBMD epithelial/anterior basement membrane dystrophy; ACIOL anterior chamber intraocular lens; IOL intraocular lens; PCIOL posterior chamber intraocular lens; Phaco/IOL phacoemulsification with intraocular lens placement; PRK photorefractive keratectomy; LASIK laser assisted in situ keratomileusis; HTN hypertension; DM diabetes mellitus; COPD chronic obstructive pulmonary disease

## 2023-12-10 ENCOUNTER — Ambulatory Visit (INDEPENDENT_AMBULATORY_CARE_PROVIDER_SITE_OTHER): Payer: 59 | Admitting: Ophthalmology

## 2023-12-10 ENCOUNTER — Encounter (INDEPENDENT_AMBULATORY_CARE_PROVIDER_SITE_OTHER): Payer: Self-pay | Admitting: Ophthalmology

## 2023-12-10 DIAGNOSIS — H25813 Combined forms of age-related cataract, bilateral: Secondary | ICD-10-CM

## 2023-12-10 DIAGNOSIS — I10 Essential (primary) hypertension: Secondary | ICD-10-CM | POA: Diagnosis not present

## 2023-12-10 DIAGNOSIS — H4311 Vitreous hemorrhage, right eye: Secondary | ICD-10-CM

## 2023-12-10 DIAGNOSIS — E113513 Type 2 diabetes mellitus with proliferative diabetic retinopathy with macular edema, bilateral: Secondary | ICD-10-CM

## 2023-12-10 DIAGNOSIS — H35033 Hypertensive retinopathy, bilateral: Secondary | ICD-10-CM | POA: Diagnosis not present

## 2023-12-12 ENCOUNTER — Encounter (INDEPENDENT_AMBULATORY_CARE_PROVIDER_SITE_OTHER): Payer: Self-pay | Admitting: Ophthalmology

## 2024-03-01 NOTE — Progress Notes (Shared)
 [facility] - Clinic Note  03/10/2024     CHIEF COMPLAINT Patient presents for No chief complaint on file.  HISTORY OF PRESENT ILLNESS: Randy Benson is a 53 y.o. male who presents to the clinic today for:    Referring physician: Gentry Kief, FNP [address] [place],  [place] [address]  HISTORICAL INFORMATION:   Selected notes from the MEDICAL RECORD NUMBER Dr. Augustus Ledger pt here for [facility]   CURRENT MEDICATIONS: Current Outpatient Medications (Ophthalmic Drugs)  Medication Sig   bacitracin -polymyxin b  (POLYSPORIN ) ophthalmic ointment Place 1 application into the left eye 3 (three) times daily. apply to eye every 12 hours while awake (Patient not taking: Reported on 12/10/2023)   gatifloxacin  (ZYMAXID ) 0.5 % SOLN Place 1 drop into the left eye 4 (four) times daily. (Patient not taking: Reported on 12/10/2023)   prednisoLONE  acetate (PRED FORTE ) 1 % ophthalmic suspension Place 1 drop into the left eye 4 (four) times daily. (Patient not taking: Reported on 12/10/2023)   No current facility-administered medications for this visit. (Ophthalmic Drugs)   Current Outpatient Medications (Other)  Medication Sig   Amlodipine -Valsartan -HCTZ 10-320-25 MG TABS Take 1 tablet by mouth daily.   aspirin  EC 81 MG tablet Take 81 mg by mouth daily.   atorvastatin  (LIPITOR) 10 MG tablet Take 1 tablet (10 mg total) by mouth daily at 6 PM.   insulin  glargine (LANTUS ) 100 UNIT/ML injection Inject 10 Units into the skin daily as needed (Blood sugar over 200).  (Patient not taking: Reported on 12/10/2023)   metFORMIN  (GLUCOPHAGE ) 1000 MG tablet Take 1,000 mg by mouth 2 (two) times daily with a meal. (Patient not taking: Reported on 12/10/2023)   tirzepatide (MOUNJARO) 7.5 MG/0.5ML Pen Inject 7.5 mg into the skin once a week.   No current facility-administered medications for this visit. (Other)   REVIEW OF SYSTEMS:    ALLERGIES No Known Allergies  PAST MEDICAL HISTORY Past Medical  History:  Diagnosis Date   Diabetes mellitus without complication (HCC)    Diabetic retinopathy (HCC)    Hypertension    Hypertensive retinopathy    Past Surgical History:  Procedure Laterality Date   GAS/FLUID EXCHANGE Left 11/08/2018   Procedure: Gas/Fluid Exchange;  Surgeon: Rexene Catching, MD;  Location: [facility] OR;  Service: Ophthalmology;  Laterality: Left;   NO PAST SURGERIES     PARS PLANA VITRECTOMY 27 GAUGE Left 11/08/2018   Procedure: PARS PLANA VITRECTOMY 27 GAUGE;  Surgeon: Rexene Catching, MD;  Location: [facility] OR;  Service: Ophthalmology;  Laterality: Left;   FAMILY HISTORY Family History  Problem Relation Age of Onset   Diabetes Father    Diabetes Brother    Diabetes Maternal Grandmother    SOCIAL HISTORY Social History   Tobacco Use   Smoking status: Never   Smokeless tobacco: Never  Vaping Use   Vaping status: Never Used  Substance Use Topics   Alcohol use: No   Drug use: No       OPHTHALMIC EXAM:  Not recorded    IMAGING AND PROCEDURES  Imaging and Procedures for 03/10/2024          ASSESSMENT/PLAN:  No diagnosis found.  Vitreous Hemorrhage OD  - delayed 3 mos to 20 mos (07.05.24 - 03.21.25) - delayed f/u from 3 mos to 5 mos - onset VH noticed while driving to work on 60.45.40 - had recently undergone diabetic medication change due to high numbers - s/p IVA OD #1 (08.26.22), #2 (09.23.22), #3 (  11.04.22) - today, VH stably improved - FA (11.04.22) shows no active leakage or NV OU - BCVA OD 20/50 (improved from 20/70)  - exam shows interval improvement in [facility] despite being lost to f/u - recommend holding injection today - pt in agreement - f/u 3-4 months -- DFE, OCT  2. Proliferative diabetic retinopathy w/o DME, OU  - pt of Dr. Augustus Ledger - s/p MPC OS (10.16.17) - s/p PRP OS (11.06.17) - s/p PRP OD (11.16.17), 11.26.19) - s/p 27g PPV/EL/IVA/FAX OS (02.18.20) - s/p IVA OS x7 (last one 11.16.20) - exam shows improved VH OD as above; OS  stable with good laser in place - OCT without diabetic macular edema OS, mild cystic changes OD - monitor - f/u in 3-4 mos  3,4. Hypertensive retinopathy OU - discussed importance of tight BP control - monitor   5. Mixed Cataract OU - The symptoms of cataract, surgical options, and treatments and risks were discussed with patient. - discussed diagnosis and progression - monitor for now  Ophthalmic Meds Ordered this visit:  No orders of the defined types were placed in this encounter.    No follow-ups on file.  There are no Patient Instructions on file for this visit.  This document serves as a record of services personally performed by Jeanice Millard, MD, PhD. It was created on their behalf by Angelia Kelp, an ophthalmic technician. The creation of this record is the provider's dictation and/or activities during the visit.    Electronically signed by: Angelia Kelp, OA, 03/01/24  10:00 AM   Jeanice Millard, M.D., Ph.D. Diseases & Surgery of the Retina and Vitreous [facility] 12/10/2023     Abbreviations: M myopia (nearsighted); A astigmatism; H hyperopia (farsighted); P presbyopia; Mrx spectacle prescription;  CTL contact lenses; OD right eye; OS left eye; OU both eyes  XT exotropia; ET esotropia; PEK punctate epithelial keratitis; PEE punctate epithelial erosions; DES dry eye syndrome; MGD meibomian gland dysfunction; ATs artificial tears; PFAT's preservative free artificial tears; NSC nuclear sclerotic cataract; PSC posterior subcapsular cataract; ERM epi-retinal membrane; PVD posterior vitreous detachment; RD retinal detachment; DM diabetes mellitus; DR diabetic retinopathy; NPDR non-proliferative diabetic retinopathy; PDR proliferative diabetic retinopathy; CSME clinically significant macular edema; DME diabetic macular edema; dbh dot blot hemorrhages; CWS cotton wool spot; POAG primary open angle glaucoma; C/D cup-to-disc ratio; HVF humphrey  visual field; GVF goldmann visual field; OCT optical coherence tomography; IOP intraocular pressure; BRVO Branch retinal vein occlusion; CRVO central retinal vein occlusion; CRAO central retinal artery occlusion; BRAO branch retinal artery occlusion; RT retinal tear; SB scleral buckle; PPV pars plana vitrectomy; VH Vitreous hemorrhage; PRP panretinal laser photocoagulation; IVK intravitreal kenalog; VMT vitreomacular traction; MH Macular hole;  NVD neovascularization of the disc; NVE neovascularization elsewhere; AREDS age related eye disease study; ARMD age related macular degeneration; POAG primary open angle glaucoma; EBMD epithelial/anterior basement membrane dystrophy; ACIOL anterior chamber intraocular lens; IOL intraocular lens; PCIOL posterior chamber intraocular lens; Phaco/IOL phacoemulsification with intraocular lens placement; PRK photorefractive keratectomy; LASIK laser assisted in situ keratomileusis; HTN hypertension; DM diabetes mellitus; COPD chronic obstructive pulmonary disease

## 2024-03-09 NOTE — Progress Notes (Signed)
 [facility] - Clinic Note  03/10/2024     CHIEF COMPLAINT Patient presents for Retina Follow Up  HISTORY OF PRESENT ILLNESS: Randy Benson is a 53 y.o. male who presents to the clinic today for:  HPI     Retina Follow Up           Diagnosis: Other   Laterality: right eye   Onset: 3 months ago         Comments   Patient here for 3 months retina follow up for [facility] OD. Patient states vision doing good. No eye pain.       Last edited by Sylvan Evener, COA on 03/10/2024 10:04 AM.    Pt states he is not having any problems with his vision  Referring physician: Gentry Kief, FNP [address] [place],  [place] [address]  HISTORICAL INFORMATION:   Selected notes from the MEDICAL RECORD NUMBER Dr. Augustus Ledger pt here for [facility]   CURRENT MEDICATIONS: Current Outpatient Medications (Ophthalmic Drugs)  Medication Sig   bacitracin -polymyxin b  (POLYSPORIN ) ophthalmic ointment Place 1 application into the left eye 3 (three) times daily. apply to eye every 12 hours while awake (Patient not taking: Reported on 03/10/2024)   gatifloxacin  (ZYMAXID ) 0.5 % SOLN Place 1 drop into the left eye 4 (four) times daily. (Patient not taking: Reported on 03/10/2024)   prednisoLONE  acetate (PRED FORTE ) 1 % ophthalmic suspension Place 1 drop into the left eye 4 (four) times daily. (Patient not taking: Reported on 03/10/2024)   No current facility-administered medications for this visit. (Ophthalmic Drugs)   Current Outpatient Medications (Other)  Medication Sig   Amlodipine -Valsartan -HCTZ 10-320-25 MG TABS Take 1 tablet by mouth daily.   aspirin  EC 81 MG tablet Take 81 mg by mouth daily.   atorvastatin  (LIPITOR) 10 MG tablet Take 1 tablet (10 mg total) by mouth daily at 6 PM.   tirzepatide (MOUNJARO) 7.5 MG/0.5ML Pen Inject 7.5 mg into the skin once a week.   insulin  glargine (LANTUS ) 100 UNIT/ML injection Inject 10 Units into the skin daily as needed (Blood sugar over 200).   (Patient not taking: Reported on 03/10/2024)   metFORMIN  (GLUCOPHAGE ) 1000 MG tablet Take 1,000 mg by mouth 2 (two) times daily with a meal. (Patient not taking: Reported on 03/10/2024)   No current facility-administered medications for this visit. (Other)   REVIEW OF SYSTEMS: ROS   Positive for: Endocrine, Cardiovascular, Eyes Negative for: Constitutional, Gastrointestinal, Neurological, Skin, Genitourinary, Musculoskeletal, HENT, Respiratory, Psychiatric, Allergic/Imm, Heme/Lymph Last edited by Sylvan Evener, COA on 03/10/2024 10:04 AM.       ALLERGIES No Known Allergies  PAST MEDICAL HISTORY Past Medical History:  Diagnosis Date   Diabetes mellitus without complication (HCC)    Diabetic retinopathy (HCC)    Hypertension    Hypertensive retinopathy    Past Surgical History:  Procedure Laterality Date   GAS/FLUID EXCHANGE Left 11/08/2018   Procedure: Gas/Fluid Exchange;  Surgeon: Rexene Catching, MD;  Location: [facility] OR;  Service: Ophthalmology;  Laterality: Left;   NO PAST SURGERIES     PARS PLANA VITRECTOMY 27 GAUGE Left 11/08/2018   Procedure: PARS PLANA VITRECTOMY 27 GAUGE;  Surgeon: Rexene Catching, MD;  Location: [facility] OR;  Service: Ophthalmology;  Laterality: Left;   FAMILY HISTORY Family History  Problem Relation Age of Onset   Diabetes Father    Diabetes Brother    Diabetes Maternal Grandmother    SOCIAL HISTORY Social History   Tobacco Use  Smoking status: Never   Smokeless tobacco: Never  Vaping Use   Vaping status: Never Used  Substance Use Topics   Alcohol use: No   Drug use: No       OPHTHALMIC EXAM:  Base Eye Exam     Visual Acuity (Snellen - Linear)       Right Left   Dist cc 20/60 -1 20/30 -2   Dist ph cc 20/50 -2     Correction: Glasses         Tonometry (Tonopen, 10:02 AM)       Right Left   Pressure 15 14         Pupils       Dark Light Shape React APD   Right 3 2 Round Minimal None   Left 3 2 Irregular Minimal None          Visual Fields (Counting fingers)       Left Right    Full Full         Extraocular Movement       Right Left    Full, Ortho Full, Ortho         Neuro/Psych     Oriented x3: Yes   Mood/Affect: Normal         Dilation     Both eyes: 1.0% Mydriacyl , 2.5% Phenylephrine  @ 10:02 AM           Slit Lamp and Fundus Exam     Slit Lamp Exam       Right Left   Lids/Lashes Dermatochalasis - upper lid Dermatochalasis - upper lid   Conjunctiva/Sclera mild melanosis, mild temporal pinguecula mild melanosis, mild temporal pinguecula   Cornea trace Punctate epithelial erosions 1+ fine Punctate epithelial erosions inferiorly   Anterior Chamber deep, clear, narrow temporal angle deep, clear, narrow temporal angle   Iris Round and dilated, No NVI Round and dilated, No NVI   Lens 2+ Nuclear sclerosis with mild brunescene, 2-3+ Cortical cataract 2+ Nuclear sclerosis with mild brunescene, 2-3+ Cortical cataract   Vitreous mild syneresis, blood stained vitreous condensations cleared post vitrectomy, clear         Fundus Exam       Right Left   Disc 2-3+ pallor, sharp rim, focal PPP ST rim, +cupping mild Pallor, Sharp rim, no heme   C/D Ratio 0.7 0.4   Macula blunted foveal reflex, RPE mottling, rare MA, no edema, focal laser changes Flat, Blunted foveal reflex, scattered MA, focal laser changes, trace cystic changes   Vessels attenuated, tortuous attenuated, Tortuous   Periphery Attached, 360 PRP visible with room for fill in far nasal and inferior periphery, scattered DBH Attached, dense 360 PRP           Refraction     Wearing Rx       Sphere Cylinder Axis   Right -3.00 +3.25 124   Left -5.50 +3.50 057           IMAGING AND PROCEDURES  Imaging and Procedures for 03/10/2024  OCT, Retina - OU - Both Eyes       Right Eye Quality was good. Central Foveal Thickness: 225. Progression has been stable. Findings include no IRF, no SRF, abnormal foveal  contour, inner retinal atrophy (stable improvement in vitreous opacities, diffuse inner retinal atrophy with irregular foveal contour/lamination, no fluid).   Left Eye Quality was good. Central Foveal Thickness: 246. Progression has been stable. Findings include normal foveal contour, no SRF, intraretinal fluid, outer retinal  atrophy (Trace cystic changes IT fovea and mac).   Notes *Images captured and stored on drive  Diagnosis / Impression:  OD: stable improvement in vitreous opacities, diffuse inner retinal atrophy with irregular foveal contour/lamination, no fluid OS: Trace cystic changes IT fovea and mac  Clinical management:  See below  Abbreviations: NFP - Normal foveal profile. CME - cystoid macular edema. PED - pigment epithelial detachment. IRF - intraretinal fluid. SRF - subretinal fluid. EZ - ellipsoid zone. ERM - epiretinal membrane. ORA - outer retinal atrophy. ORT - outer retinal tubulation. SRHM - subretinal hyper-reflective material. IRHM - intraretinal hyper-reflective material             ASSESSMENT/PLAN:    ICD-10-CM   1. Vitreous hemorrhage of right eye (HCC)  H43.11     2. 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 Essential hypertension  I10     4. Hypertensive retinopathy of both eyes  H35.033     5. Combined forms of age-related cataract of both eyes  H25.813       Vitreous Hemorrhage OD  - delayed 3 mos to 20 mos (07.05.24 - 03.21.25) - delayed f/u from 3 mos to 5 mos - onset VH noticed while driving to work on 40.98.11 - had recently undergone diabetic medication change due to high numbers - s/p IVA OD #1 (08.26.22), #2 (09.23.22), #3 (11.04.22) - today, VH stably improved - FA (11.04.22) shows no active leakage or NV OU - BCVA OD 20/50 (improved from 20/70)  - exam shows interval improvement in [facility] despite being lost to f/u -  recommend holding injection today - pt in agreement - f/u 4 months -- DFE, OCT  2. Proliferative diabetic retinopathy w/o DME, OU  - pt of Dr. Augustus Ledger - s/p MPC OS (10.16.17) - s/p PRP OS (11.06.17) - s/p PRP OD (11.16.17), 11.26.19) - s/p 27g PPV/EL/IVA/FAX OS (02.18.20) - s/p IVA OS x7 (last one 11.16.20) - exam shows improved VH OD as above; OS stable with good laser in place - OCT without diabetic macular edema OS, mild cystic changes OD - monitor - f/u in 4 mos  3,4. Hypertensive retinopathy OU - discussed importance of tight BP control - monitor   5. Mixed Cataract OU - The symptoms of cataract, surgical options, and treatments and risks were discussed with patient. - discussed diagnosis and progression - monitor for now  Ophthalmic Meds Ordered this visit:  No orders of the defined types were placed in this encounter.    Return in about 4 months (around 07/10/2024) for f/u PDR OU, DFE, OCT.  There are no Patient Instructions on file for this visit.  This document serves as a record of services personally performed by Jeanice Millard, MD, PhD. It was created on their behalf by Morley Arabia. Bevin Bucks, OA an ophthalmic technician. The creation of this record is the provider's dictation and/or activities during the visit.    Electronically signed by: Morley Arabia. Bevin Bucks, OA 03/10/24 11:50 AM    Jeanice Millard, M.D., Ph.D. Diseases & Surgery of the Retina and Vitreous [facility] 03/10/2024     Abbreviations: M myopia (nearsighted); A astigmatism; H hyperopia (farsighted); P presbyopia; Mrx spectacle prescription;  CTL contact lenses; OD right eye; OS left eye; OU both eyes  XT exotropia; ET esotropia; PEK punctate epithelial keratitis; PEE punctate epithelial  erosions; DES dry eye syndrome; MGD meibomian gland dysfunction; ATs artificial tears; PFAT's preservative free artificial tears; NSC nuclear sclerotic cataract; PSC posterior subcapsular cataract;  ERM epi-retinal membrane; PVD posterior vitreous detachment; RD retinal detachment; DM diabetes mellitus; DR diabetic retinopathy; NPDR non-proliferative diabetic retinopathy; PDR proliferative diabetic retinopathy; CSME clinically significant macular edema; DME diabetic macular edema; dbh dot blot hemorrhages; CWS cotton wool spot; POAG primary open angle glaucoma; C/D cup-to-disc ratio; HVF humphrey visual field; GVF goldmann visual field; OCT optical coherence tomography; IOP intraocular pressure; BRVO Branch retinal vein occlusion; CRVO central retinal vein occlusion; CRAO central retinal artery occlusion; BRAO branch retinal artery occlusion; RT retinal tear; SB scleral buckle; PPV pars plana vitrectomy; VH Vitreous hemorrhage; PRP panretinal laser photocoagulation; IVK intravitreal kenalog; VMT vitreomacular traction; MH Macular hole;  NVD neovascularization of the disc; NVE neovascularization elsewhere; AREDS age related eye disease study; ARMD age related macular degeneration; POAG primary open angle glaucoma; EBMD epithelial/anterior basement membrane dystrophy; ACIOL anterior chamber intraocular lens; IOL intraocular lens; PCIOL posterior chamber intraocular lens; Phaco/IOL phacoemulsification with intraocular lens placement; PRK photorefractive keratectomy; LASIK laser assisted in situ keratomileusis; HTN hypertension; DM diabetes mellitus; COPD chronic obstructive pulmonary disease

## 2024-03-10 ENCOUNTER — Encounter (INDEPENDENT_AMBULATORY_CARE_PROVIDER_SITE_OTHER): Admitting: Ophthalmology

## 2024-03-10 ENCOUNTER — Ambulatory Visit (INDEPENDENT_AMBULATORY_CARE_PROVIDER_SITE_OTHER): Admitting: Ophthalmology

## 2024-03-10 ENCOUNTER — Encounter (INDEPENDENT_AMBULATORY_CARE_PROVIDER_SITE_OTHER): Payer: Self-pay | Admitting: Ophthalmology

## 2024-03-10 DIAGNOSIS — I10 Essential (primary) hypertension: Secondary | ICD-10-CM

## 2024-03-10 DIAGNOSIS — H4311 Vitreous hemorrhage, right eye: Secondary | ICD-10-CM

## 2024-03-10 DIAGNOSIS — H35033 Hypertensive retinopathy, bilateral: Secondary | ICD-10-CM | POA: Diagnosis not present

## 2024-03-10 DIAGNOSIS — H25813 Combined forms of age-related cataract, bilateral: Secondary | ICD-10-CM

## 2024-03-10 DIAGNOSIS — E113513 Type 2 diabetes mellitus with proliferative diabetic retinopathy with macular edema, bilateral: Secondary | ICD-10-CM

## 2024-03-12 ENCOUNTER — Encounter (INDEPENDENT_AMBULATORY_CARE_PROVIDER_SITE_OTHER): Payer: Self-pay | Admitting: Ophthalmology

## 2024-07-12 NOTE — Progress Notes (Shared)
 [facility] - Clinic Note  07/14/2024     CHIEF COMPLAINT Patient presents for No chief complaint on file.  HISTORY OF PRESENT ILLNESS: Randy Benson is a 53 y.o. male who presents to the clinic today for:   Pt states he is not having any problems with his vision  Referring physician: Blinda Milling, FNP [address] [place],  [place] [address]  HISTORICAL INFORMATION:   Selected notes from the MEDICAL RECORD NUMBER Dr. Alvia pt here for [facility]   CURRENT MEDICATIONS: Current Outpatient Medications (Ophthalmic Drugs)  Medication Sig   bacitracin -polymyxin b  (POLYSPORIN ) ophthalmic ointment Place 1 application into the left eye 3 (three) times daily. apply to eye every 12 hours while awake (Patient not taking: Reported on 03/10/2024)   gatifloxacin  (ZYMAXID ) 0.5 % SOLN Place 1 drop into the left eye 4 (four) times daily. (Patient not taking: Reported on 03/10/2024)   prednisoLONE  acetate (PRED FORTE ) 1 % ophthalmic suspension Place 1 drop into the left eye 4 (four) times daily. (Patient not taking: Reported on 03/10/2024)   No current facility-administered medications for this visit. (Ophthalmic Drugs)   Current Outpatient Medications (Other)  Medication Sig   Amlodipine -Valsartan -HCTZ 10-320-25 MG TABS Take 1 tablet by mouth daily.   aspirin  EC 81 MG tablet Take 81 mg by mouth daily.   atorvastatin  (LIPITOR) 10 MG tablet Take 1 tablet (10 mg total) by mouth daily at 6 PM.   insulin  glargine (LANTUS ) 100 UNIT/ML injection Inject 10 Units into the skin daily as needed (Blood sugar over 200).  (Patient not taking: Reported on 03/10/2024)   metFORMIN  (GLUCOPHAGE ) 1000 MG tablet Take 1,000 mg by mouth 2 (two) times daily with a meal. (Patient not taking: Reported on 03/10/2024)   tirzepatide (MOUNJARO) 7.5 MG/0.5ML Pen Inject 7.5 mg into the skin once a week.   No current facility-administered medications for this visit. (Other)   REVIEW OF  SYSTEMS:     ALLERGIES No Known Allergies  PAST MEDICAL HISTORY Past Medical History:  Diagnosis Date   Diabetes mellitus without complication (HCC)    Diabetic retinopathy (HCC)    Hypertension    Hypertensive retinopathy    Past Surgical History:  Procedure Laterality Date   GAS/FLUID EXCHANGE Left 11/08/2018   Procedure: Gas/Fluid Exchange;  Surgeon: Alvia Norleen JONETTA, MD;  Location: [facility] OR;  Service: Ophthalmology;  Laterality: Left;   NO PAST SURGERIES     PARS PLANA VITRECTOMY 27 GAUGE Left 11/08/2018   Procedure: PARS PLANA VITRECTOMY 27 GAUGE;  Surgeon: Alvia Norleen JONETTA, MD;  Location: [facility] OR;  Service: Ophthalmology;  Laterality: Left;   FAMILY HISTORY Family History  Problem Relation Age of Onset   Diabetes Father    Diabetes Brother    Diabetes Maternal Grandmother    SOCIAL HISTORY Social History   Tobacco Use   Smoking status: Never   Smokeless tobacco: Never  Vaping Use   Vaping status: Never Used  Substance Use Topics   Alcohol use: No   Drug use: No       OPHTHALMIC EXAM:  Not recorded    IMAGING AND PROCEDURES  Imaging and Procedures for 07/14/2024          ASSESSMENT/PLAN:  No diagnosis found.  Vitreous Hemorrhage OD  - delayed 3 mos to 20 mos (07.05.24 - 03.21.25) - onset VH noticed while driving to work on 91.73.77 - had recently undergone diabetic medication change due to high numbers - s/p IVA OD #1 (08.26.22), #  2 (09.23.22), #3 (11.04.22) - today, VH stably improved - FA (11.04.22) shows no active leakage or NV OU - BCVA OD 20/50 -- stable - recommend holding injection today - pt in agreement - f/u 4 months -- DFE, OCT  2. Proliferative diabetic retinopathy w/o DME, OU  - formerly pt of Dr. Alvia - s/p MPC OS (10.16.17) - s/p PRP OS (11.06.17) - s/p PRP OD (11.16.17), (11.26.19) - s/p 27g PPV/EL/IVA/FAX OS (02.18.20) - s/p IVA OS x7 (last one 11.16.20) - exam shows improved VH OD as above; OS stable with good  laser in place - OCT without diabetic macular edema OS, mild cystic changes OD - monitor - f/u in 4 mos  3,4. Hypertensive retinopathy OU - discussed importance of tight BP control - monitor   5. Mixed Cataract OU - The symptoms of cataract, surgical options, and treatments and risks were discussed with patient. - discussed diagnosis and progression - monitor for now  Ophthalmic Meds Ordered this visit:  No orders of the defined types were placed in this encounter.    No follow-ups on file.  There are no Patient Instructions on file for this visit.  This document serves as a record of services personally performed by Redell JUDITHANN Hans, MD, PhD. It was created on their behalf by Delon Newness COT, an ophthalmic technician. The creation of this record is the provider's dictation and/or activities during the visit.    Electronically signed by: Delon Newness COT 10.22.25 2:06 PM   Abbreviations: M myopia (nearsighted); A astigmatism; H hyperopia (farsighted); P presbyopia; Mrx spectacle prescription;  CTL contact lenses; OD right eye; OS left eye; OU both eyes  XT exotropia; ET esotropia; PEK punctate epithelial keratitis; PEE punctate epithelial erosions; DES dry eye syndrome; MGD meibomian gland dysfunction; ATs artificial tears; PFAT's preservative free artificial tears; NSC nuclear sclerotic cataract; PSC posterior subcapsular cataract; ERM epi-retinal membrane; PVD posterior vitreous detachment; RD retinal detachment; DM diabetes mellitus; DR diabetic retinopathy; NPDR non-proliferative diabetic retinopathy; PDR proliferative diabetic retinopathy; CSME clinically significant macular edema; DME diabetic macular edema; dbh dot blot hemorrhages; CWS cotton wool spot; POAG primary open angle glaucoma; C/D cup-to-disc ratio; HVF humphrey visual field; GVF goldmann visual field; OCT optical coherence tomography; IOP intraocular pressure; BRVO Branch retinal vein occlusion; CRVO  central retinal vein occlusion; CRAO central retinal artery occlusion; BRAO branch retinal artery occlusion; RT retinal tear; SB scleral buckle; PPV pars plana vitrectomy; VH Vitreous hemorrhage; PRP panretinal laser photocoagulation; IVK intravitreal kenalog; VMT vitreomacular traction; MH Macular hole;  NVD neovascularization of the disc; NVE neovascularization elsewhere; AREDS age related eye disease study; ARMD age related macular degeneration; POAG primary open angle glaucoma; EBMD epithelial/anterior basement membrane dystrophy; ACIOL anterior chamber intraocular lens; IOL intraocular lens; PCIOL posterior chamber intraocular lens; Phaco/IOL phacoemulsification with intraocular lens placement; PRK photorefractive keratectomy; LASIK laser assisted in situ keratomileusis; HTN hypertension; DM diabetes mellitus; COPD chronic obstructive pulmonary disease

## 2024-07-14 ENCOUNTER — Encounter (INDEPENDENT_AMBULATORY_CARE_PROVIDER_SITE_OTHER): Payer: Self-pay

## 2024-07-14 ENCOUNTER — Encounter (INDEPENDENT_AMBULATORY_CARE_PROVIDER_SITE_OTHER): Payer: Self-pay | Admitting: Ophthalmology

## 2024-07-14 DIAGNOSIS — I10 Essential (primary) hypertension: Secondary | ICD-10-CM

## 2024-07-14 DIAGNOSIS — H4311 Vitreous hemorrhage, right eye: Secondary | ICD-10-CM

## 2024-07-14 DIAGNOSIS — E113513 Type 2 diabetes mellitus with proliferative diabetic retinopathy with macular edema, bilateral: Secondary | ICD-10-CM

## 2024-07-14 DIAGNOSIS — H25813 Combined forms of age-related cataract, bilateral: Secondary | ICD-10-CM

## 2024-07-14 DIAGNOSIS — H35033 Hypertensive retinopathy, bilateral: Secondary | ICD-10-CM
